# Patient Record
Sex: Female | Born: 1940 | Race: White | Hispanic: No | Smoking: Former smoker
Health system: Southern US, Community
[De-identification: ages and names within clinical notes are randomized; demographics above are authoritative.]

## PROBLEM LIST (undated history)

## (undated) DIAGNOSIS — G43909 Migraine, unspecified, not intractable, without status migrainosus: Secondary | ICD-10-CM

## (undated) DIAGNOSIS — M8580 Other specified disorders of bone density and structure, unspecified site: Secondary | ICD-10-CM

## (undated) DIAGNOSIS — I8390 Asymptomatic varicose veins of unspecified lower extremity: Secondary | ICD-10-CM

## (undated) DIAGNOSIS — E785 Hyperlipidemia, unspecified: Secondary | ICD-10-CM

## (undated) DIAGNOSIS — R7989 Other specified abnormal findings of blood chemistry: Secondary | ICD-10-CM

## (undated) DIAGNOSIS — E079 Disorder of thyroid, unspecified: Secondary | ICD-10-CM

## (undated) DIAGNOSIS — M797 Fibromyalgia: Secondary | ICD-10-CM

## (undated) DIAGNOSIS — S060X9A Concussion with loss of consciousness of unspecified duration, initial encounter: Secondary | ICD-10-CM

## (undated) DIAGNOSIS — R945 Abnormal results of liver function studies: Secondary | ICD-10-CM

## (undated) DIAGNOSIS — K760 Fatty (change of) liver, not elsewhere classified: Secondary | ICD-10-CM

## (undated) DIAGNOSIS — G709 Myoneural disorder, unspecified: Secondary | ICD-10-CM

## (undated) HISTORY — DX: Asymptomatic varicose veins of unspecified lower extremity: I83.90

## (undated) HISTORY — PX: OTHER SURGICAL HISTORY: SHX169

## (undated) HISTORY — DX: Hyperlipidemia, unspecified: E78.5

## (undated) HISTORY — DX: Disorder of thyroid, unspecified: E07.9

## (undated) HISTORY — DX: Fibromyalgia: M79.7

## (undated) HISTORY — DX: Migraine, unspecified, not intractable, without status migrainosus: G43.909

## (undated) HISTORY — PX: BREAST SURGERY: SHX581

## (undated) HISTORY — DX: Other specified disorders of bone density and structure, unspecified site: M85.80

## (undated) HISTORY — DX: Abnormal results of liver function studies: R94.5

## (undated) HISTORY — PX: APPENDECTOMY: SHX54

## (undated) HISTORY — DX: Other specified abnormal findings of blood chemistry: R79.89

## (undated) HISTORY — DX: Concussion with loss of consciousness of unspecified duration, initial encounter: S06.0X9A

## (undated) HISTORY — PX: TONSILLECTOMY: SUR1361

## (undated) HISTORY — DX: Myoneural disorder, unspecified: G70.9

## (undated) HISTORY — DX: Fatty (change of) liver, not elsewhere classified: K76.0

## (undated) HISTORY — PX: TUBAL LIGATION: SHX77

## (undated) HISTORY — PX: ROTATOR CUFF REPAIR: SHX139

---

## 1998-08-14 ENCOUNTER — Encounter: Payer: Self-pay | Admitting: Family Medicine

## 1998-08-14 ENCOUNTER — Ambulatory Visit (HOSPITAL_COMMUNITY): Admission: RE | Admit: 1998-08-14 | Discharge: 1998-08-14 | Payer: Self-pay | Admitting: Family Medicine

## 1999-08-24 ENCOUNTER — Encounter: Admission: RE | Admit: 1999-08-24 | Discharge: 1999-08-24 | Payer: Self-pay | Admitting: Family Medicine

## 1999-08-24 ENCOUNTER — Encounter: Payer: Self-pay | Admitting: Family Medicine

## 1999-08-30 ENCOUNTER — Encounter: Admission: RE | Admit: 1999-08-30 | Discharge: 1999-08-30 | Payer: Self-pay | Admitting: Family Medicine

## 1999-08-30 ENCOUNTER — Encounter: Payer: Self-pay | Admitting: Family Medicine

## 2000-06-11 ENCOUNTER — Ambulatory Visit (HOSPITAL_COMMUNITY): Admission: RE | Admit: 2000-06-11 | Discharge: 2000-06-11 | Payer: Self-pay | Admitting: Family Medicine

## 2000-06-11 ENCOUNTER — Encounter: Payer: Self-pay | Admitting: Family Medicine

## 2000-07-18 ENCOUNTER — Ambulatory Visit (HOSPITAL_COMMUNITY): Admission: RE | Admit: 2000-07-18 | Discharge: 2000-07-18 | Payer: Self-pay | Admitting: Family Medicine

## 2000-07-18 ENCOUNTER — Encounter: Payer: Self-pay | Admitting: Family Medicine

## 2000-10-23 ENCOUNTER — Encounter: Payer: Self-pay | Admitting: Family Medicine

## 2000-10-23 ENCOUNTER — Encounter: Admission: RE | Admit: 2000-10-23 | Discharge: 2000-10-23 | Payer: Self-pay | Admitting: Family Medicine

## 2002-04-02 ENCOUNTER — Encounter: Admission: RE | Admit: 2002-04-02 | Discharge: 2002-04-02 | Payer: Self-pay | Admitting: Internal Medicine

## 2002-04-02 ENCOUNTER — Encounter: Payer: Self-pay | Admitting: Internal Medicine

## 2003-04-29 ENCOUNTER — Encounter: Admission: RE | Admit: 2003-04-29 | Discharge: 2003-04-29 | Payer: Self-pay | Admitting: Internal Medicine

## 2004-03-15 ENCOUNTER — Ambulatory Visit: Payer: Self-pay | Admitting: *Deleted

## 2004-05-01 ENCOUNTER — Encounter: Admission: RE | Admit: 2004-05-01 | Discharge: 2004-05-01 | Payer: Self-pay | Admitting: Internal Medicine

## 2005-11-04 ENCOUNTER — Encounter: Admission: RE | Admit: 2005-11-04 | Discharge: 2005-11-04 | Payer: Self-pay | Admitting: Internal Medicine

## 2006-03-19 ENCOUNTER — Ambulatory Visit: Payer: Self-pay | Admitting: Internal Medicine

## 2006-11-24 ENCOUNTER — Encounter: Admission: RE | Admit: 2006-11-24 | Discharge: 2006-11-24 | Payer: Self-pay | Admitting: Internal Medicine

## 2007-11-25 ENCOUNTER — Encounter: Admission: RE | Admit: 2007-11-25 | Discharge: 2007-11-25 | Payer: Self-pay | Admitting: Internal Medicine

## 2009-02-17 ENCOUNTER — Encounter: Admission: RE | Admit: 2009-02-17 | Discharge: 2009-02-17 | Payer: Self-pay | Admitting: Internal Medicine

## 2009-08-18 ENCOUNTER — Encounter (INDEPENDENT_AMBULATORY_CARE_PROVIDER_SITE_OTHER): Payer: Self-pay | Admitting: *Deleted

## 2009-09-20 ENCOUNTER — Encounter: Admission: RE | Admit: 2009-09-20 | Discharge: 2009-09-20 | Payer: Self-pay | Admitting: Orthopedic Surgery

## 2009-09-20 ENCOUNTER — Encounter (INDEPENDENT_AMBULATORY_CARE_PROVIDER_SITE_OTHER): Payer: Self-pay | Admitting: *Deleted

## 2009-09-22 ENCOUNTER — Ambulatory Visit: Payer: Self-pay | Admitting: Internal Medicine

## 2009-10-03 ENCOUNTER — Ambulatory Visit: Payer: Self-pay | Admitting: Internal Medicine

## 2009-10-04 ENCOUNTER — Encounter: Payer: Self-pay | Admitting: Internal Medicine

## 2010-01-24 ENCOUNTER — Encounter: Admission: RE | Admit: 2010-01-24 | Discharge: 2010-01-24 | Payer: Self-pay | Admitting: Orthopedic Surgery

## 2010-03-02 ENCOUNTER — Encounter
Admission: RE | Admit: 2010-03-02 | Discharge: 2010-03-02 | Payer: Self-pay | Source: Home / Self Care | Attending: Internal Medicine | Admitting: Internal Medicine

## 2010-03-07 ENCOUNTER — Encounter
Admission: RE | Admit: 2010-03-07 | Discharge: 2010-03-07 | Payer: Self-pay | Source: Home / Self Care | Attending: Internal Medicine | Admitting: Internal Medicine

## 2010-04-07 ENCOUNTER — Encounter: Payer: Self-pay | Admitting: Orthopedic Surgery

## 2010-04-17 NOTE — Miscellaneous (Signed)
Summary: anusol HC suppos. order  Clinical Lists Changes  Medications: Added new medication of ANUSOL-HC 25 MG  SUPP (HYDROCORTISONE ACETATE) one suppository per rectum every night - Signed Rx of ANUSOL-HC 25 MG  SUPP (HYDROCORTISONE ACETATE) one suppository per rectum every night;  #12 x 3;  Signed;  Entered by: Alease Frame RN;  Authorized by: Hart Carwin MD;  Method used: Electronically to [facility] [phone]*, [address] [place], [place], [place]  [address], Ph: [phone], Fax: [phone] Observations: Added new observation of ALLERGY REV: Done (10/03/2009 10:25)    Prescriptions: ANUSOL-HC 25 MG  SUPP (HYDROCORTISONE ACETATE) one suppository per rectum every night  #12 x 3   Entered by:   Alease Frame RN   Authorized by:   Hart Carwin MD   Signed by:   Alease Frame RN on 10/03/2009   Method used:   Electronically to        [facility] [phone]* (retail)       [address]       [place], [place]  [address]       Ph: [phone]       Fax: [phone]   RxID:   [id number]

## 2010-04-17 NOTE — Miscellaneous (Signed)
Summary: [facility]  Clinical Lists Changes  Medications: Added new medication of MIRALAX   POWD (POLYETHYLENE GLYCOL 3350) As per prep  instructions. - Signed Added new medication of DULCOLAX 5 MG  TBEC (BISACODYL) Day before procedure take 2 at 3pm and 2 at 8pm. - Signed Added new medication of REGLAN 10 MG  TABS (METOCLOPRAMIDE HCL) As per prep instructions. - Signed Rx of MIRALAX   POWD (POLYETHYLENE GLYCOL 3350) As per prep  instructions.;  #255gm x 0;  Signed;  Entered by: Ezra Sites RN;  Authorized by: Hart Carwin MD;  Method used: Electronically to [facility] [phone]*, [address], [place], [place], [place]  [address], Ph: [phone], Fax: [phone] Rx of DULCOLAX 5 MG  TBEC (BISACODYL) Day before procedure take 2 at 3pm and 2 at 8pm.;  #4 x 0;  Signed;  Entered by: Ezra Sites RN;  Authorized by: Hart Carwin MD;  Method used: Electronically to [facility] [phone]*, [address], [place], [place], [place]  [address], Ph: [phone], Fax: [phone] Rx of REGLAN 10 MG  TABS (METOCLOPRAMIDE HCL) As per prep instructions.;  #2 x 0;  Signed;  Entered by: Ezra Sites RN;  Authorized by: Hart Carwin MD;  Method used: Electronically to [facility] [phone]*, [address] [place], [place], [place]  [address], Ph: [phone], Fax: [phone] Allergies: Added new allergy or adverse reaction of PCN Added new allergy or adverse reaction of * ROXILOX Observations: Added new observation of NKA: F (09/22/2009 12:45)    Prescriptions: REGLAN 10 MG  TABS (METOCLOPRAMIDE HCL) As per prep instructions.  #2 x 0   Entered by:   Ezra Sites RN   Authorized by:   Hart Carwin MD   Signed by:   Ezra Sites RN on 09/22/2009   Method used:   Electronically to        [facility] [phone]* (retail)       [address]       [place], [place]  [address]       Ph: [phone]       Fax: [phone]  RxID:   [id number] DULCOLAX 5 MG  TBEC (BISACODYL) Day before procedure take 2 at 3pm and 2 at 8pm.  #4 x 0   Entered by:   Ezra Sites RN   Authorized by:   Hart Carwin MD   Signed by:   Ezra Sites RN on 09/22/2009   Method used:   Electronically to        [facility] [phone]* (retail)       [address]       [place], [place]  [address]       Ph: [phone]       Fax: [phone]   RxID:   [id number] MIRALAX   POWD (POLYETHYLENE GLYCOL 3350) As per prep  instructions.  #255gm x 0   Entered by:   Ezra Sites RN   Authorized by:   Hart Carwin MD   Signed by:   Ezra Sites RN on 09/22/2009   Method used:   Electronically to        [facility] [phone]* (retail)       [address]       [place], [place]  [address]       Ph: [phone]       Fax: [phone]   RxID:   [id number]

## 2010-04-17 NOTE — Letter (Signed)
Summary: Previsit letter  [facility]  [address] [place], [place] [address]   Phone: [phone]  Fax: [phone]       08/18/2009 MRN: [id number]  SELICIA WINDOM [address] [place], [place]  [address]  Dear Ms. Ro[place]horse,  Welcome to the Gastroenterology Division at [facility].    You are scheduled to see a nurse for your pre-procedure visit on 09/22/2009 at 1:00PM on the 3rd floor at [facility], [address]  We ask that you try to arrive at our office 15 minutes prior to your appointment time to allow for check-in.  Your nurse visit will consist of discussing your medical and surgical history, your immediate family medical history, and your medications.    Please bring a complete list of all your medications or, if you prefer, bring the medication bottles and we will list them.  We will need to be aware of both prescribed and over the counter drugs.  We will need to know exact dosage information as well.  If you are on blood thinners (Coumadin, Plavix, Aggrenox, Ticlid, etc.) please call our office today/prior to your appointment, as we need to consult with your physician about holding your medication.   Please be prepared to read and sign documents such as consent forms, a financial agreement, and acknowledgement forms.  If necessary, and with your consent, a friend or relative is welcome to sit-in on the nurse visit with you.  Please bring your insurance card so that we may make a copy of it.  If your insurance requires a referral to see a specialist, please bring your referral form from your primary care physician.  No co-pay is required for this nurse visit.     If you cannot keep your appointment, please call [phone] to cancel or reschedule prior to your appointment date.  This allows [place] the opportunity to schedule an appointment for another patient in need of care.    Thank you for choosing [facility] for your medical needs.  We  appreciate the opportunity to care for you.  Please visit [place] at our website  to learn more about our practice.                     Sincerely.                                                                                                                   The Gastroenterology Division

## 2010-04-17 NOTE — Letter (Signed)
Summary: [facility] Instructions  [facility]  [address] [place], [place] [address]   Phone: [phone]  Fax: [phone]       ADYLYNN HERTENSTEIN    12-29-1940    MRN: [id number]       Procedure Day /Date:  Tuesday 10/03/2009     Arrival Time: 8:30 am     Procedure Time: 9:30 am     Location of Procedure:                    _ x_  [facility] (4th Floor)    PREPARATION FOR COLONOSCOPY WITH MIRALAX  Starting 5 days prior to your procedure Thursday  7/14 do not eat nuts, seeds, popcorn, corn, beans, peas,  salads, or any raw vegetables.  Do not take any fiber supplements (e.g. Metamucil, Citrucel, and Benefiber). ____________________________________________________________________________________________________   THE DAY BEFORE YOUR PROCEDURE         DATE: Monday 7/18  1   Drink clear liquids the entire day-NO SOLID FOOD  2   Do not drink anything colored red or purple.  Avoid juices with pulp.  No orange juice.  3   Drink at least 64 oz. (8 glasses) of fluid/clear liquids during the day to prevent dehydration and help the prep work efficiently.  CLEAR LIQUIDS INCLUDE: Water Jello Ice Popsicles Tea (sugar ok, no milk/cream) Powdered fruit flavored drinks Coffee (sugar ok, no milk/cream) Gatorade Juice: apple, white grape, white cranberry  Lemonade Clear bullion, consomm, broth Carbonated beverages (any kind) Strained chicken noodle soup Hard Candy  4   Mix the entire bottle of Miralax with 64 oz. of Gatorade/Powerade in the morning and put in the refrigerator to chill.  5   At 3:00 pm take 2 Dulcolax/Bisacodyl tablets.  6   At 4:30 pm take one Reglan/Metoclopramide tablet.  7  Starting at 5:00 pm drink one 8 oz glass of the Miralax mixture every 15-20 minutes until you have finished drinking the entire 64 oz.  You should finish drinking prep around 7:30 or 8:00 pm.  8   If you are nauseated, you may take the 2nd Reglan/Metoclopramide  tablet at 6:30 pm.        9    At 8:00 pm take 2 more DULCOLAX/Bisacodyl tablets.     THE DAY OF YOUR PROCEDURE      DATE:  Tuesday 7/19  You may drink clear liquids until 7:30 am (2 HOURS BEFORE PROCEDURE).   MEDICATION INSTRUCTIONS  Unless otherwise instructed, you should take regular prescription medications with a small sip of water as early as possible the morning of your procedure.           OTHER INSTRUCTIONS  You will need a responsible adult at least 68 years of age to accompany you and drive you home.   This person must remain in the waiting room during your procedure.  Wear loose fitting clothing that is easily removed.  Leave jewelry and other valuables at home.  However, you may wish to bring a book to read or an iPod/MP3 player to listen to music as you wait for your procedure to start.  Remove all body piercing jewelry and leave at home.  Total time from sign-in until discharge is approximately 2-3 hours.  You should go home directly after your procedure and rest.  You can resume normal activities the day after your procedure.  The day of your procedure you should not:   Drive  Make legal decisions   Operate machinery   Drink alcohol   Return to work  You will receive specific instructions about eating, activities and medications before you leave.   The above instructions have been reviewed and explained to me by  Ezra Sites RN  September 22, 2009 1:68 PM     I fully understand and can verbalize these instructions _____________________________ Date _______

## 2010-04-17 NOTE — Procedures (Signed)
Summary: Colonoscopy  Patient: [name] Note: All result statuses are Final unless otherwise noted.  Tests: (1) Colonoscopy (COL)   COL Colonoscopy           DONE     [facility]     [address]     [place], [place]  [address]           COLONOSCOPY PROCEDURE REPORT           PATIENT:  Kelsey Warner, Kelsey Warner  MR#:  [id number]     BIRTHDATE:  05/14/1940, 68 yrs. old  GENDER:  female     ENDOSCOPIST:  Hedwig Morton. Juanda Chance, MD     REF. BY:  Robert Bellow, M.D.     PROCEDURE DATE:  10/03/2009     PROCEDURE:  Colonoscopy 19147     ASA CLASS:  Class I     INDICATIONS:  Routine Risk Screening, hematochezia     MEDICATIONS:   Versed 7 mg, Fentanyl 75 mcg           DESCRIPTION OF PROCEDURE:   After the risks benefits and     alternatives of the procedure were thoroughly explained, informed     consent was obtained.  Digital rectal exam was performed and     revealed no rectal masses.   The LB CF-[id number] endoscope     was introduced through the anus and advanced to the cecum, which     was identified by both the appendix and ileocecal valve, without     limitations.  The quality of the prep was good, using MiraLax.     The instrument was then slowly withdrawn as the colon was fully     examined.     <<PROCEDUREIMAGES>>           FINDINGS:  A sessile polyp was found. at 70 cm, 3 mm polyp Polyp     was snared without cautery. Retrieval was successful (see image4).     snare polyp  Mild diverticulosis was found in the sigmoid colon     (see image5 and image6).  Internal hemorrhoids were found (see     image7).  This was otherwise a normal examination of the colon     (see image3, image2, and image1).   Retroflexed views in the     rectum revealed no abnormalities.    The scope was then withdrawn     from the patient and the procedure completed.           COMPLICATIONS:  None     ENDOSCOPIC IMPRESSION:     1) Sessile polyp     2) Mild diverticulosis in the sigmoid colon     3)  Internal hemorrhoids     4) Otherwise normal examination     RECOMMENDATIONS:     1) Await pathology results     2) High fiber diet.     Anusol HC supp 1 hs, #12, 3 refills, if no improvement in     bleeding, consider band ligation     REPEAT EXAM:  In 10 year(s) for.           ______________________________     Hedwig Morton. Juanda Chance, MD           CC:           n.     eSIGNED:   Hedwig Morton. Alanya Vukelich at 10/03/2009 10:12 AM           Hulan Fray,  [id number]  Note: An exclamation mark (!) indicates a result that was not dispersed into the flowsheet. Document Creation Date: 10/03/2009 10:14 AM _______________________________________________________________________  (1) Order result status: Final Collection or observation date-time: 10/03/2009 10:03 Requested date-time:  Receipt date-time:  Reported date-time:  Referring Physician:   Ordering Physician: Lina Sar [phone]) Specimen Source:  Source: Launa Grill Order Number: [id number] Lab site:   Appended Document: Colonoscopy recall 10 years  Appended Document: Colonoscopy     Procedures Next Due Date:    Colonoscopy: 10/2019

## 2010-04-17 NOTE — Letter (Signed)
Summary: Patient Notice- Polyp Results  [facility]  [address] [place], [place] [address]   Phone: [phone]  Fax: [phone]        October 04, 2009 MRN: [id number]    Kelsey Warner [address] [place], [place]  [address]    Dear Ms. Boerner,  I am pleased to inform you that the colon polyp(s) removed during your recent colonoscopy was (were) found to be benign (no cancer detected) upon pathologic examination.The polyp consisted of lymphatic tissue ( not premalignant)  I recommend you have a repeat colonoscopy examination in 10_ years to look for recurrent polyps, as having colon polyps increases your risk for having recurrent polyps or even colon cancer in the future.  Should you develop new or worsening symptoms of abdominal pain, bowel habit changes or bleeding from the rectum or bowels, please schedule an evaluation with either your primary care physician or with me.  Additional information/recommendations:  _x_ No further action with gastroenterology is needed at this time. Please      follow-up with your primary care physician for your other healthcare      needs.  __ Please call [phone] to schedule a return visit to review your      situation.  __ Please keep your follow-up visit as already scheduled.  __ Continue treatment plan as outlined the day of your exam.  Please call us if you are having persistent problems or have questions about your condition that have not been fully answered at this time.  Sincerely,  Hart Carwin MD  This letter has been electronically signed by your physician.  Appended Document: Patient Notice- Polyp Results letter mailed

## 2010-08-03 NOTE — Assessment & Plan Note (Signed)
[facility]                         GASTROENTEROLOGY OFFICE NOTE   Kelsey Warner, Kelsey Warner                      MRN:          [id number]  DATE:03/19/2006                            DOB:          [birth year]    GI CONSULTATION:  Kelsey Warner is a very nice, 70 year old white female, who is here to  discuss colorectal screening.  She had a pre-GEN-PLUS genetic testing of  stool for colon cancer in 2005, which was negative.  We do not have the  results of it; she has them at home.  She would like to avoid having  colonoscopy, virtual colonoscopy or barium enema, and would like some  information on how reliable this test is.  The patient is having  intermittent rectal bleeding, associated with external hemorrhoids.  These have been banded and treated in the past by a surgeon, last time  about six years ago.  She has no family history of colon cancer and  takes two stool softeners daily for intermittent constipation.   MEDICATIONS:  1. Evista 60 mg p.o. daily.  2. Synthroid 137 micrograms daily.  3. Ultram 50 mg daily.  4. Zetia.  5. Aspirin 81 mg daily.  6. Vitamin B12, D, E daily.   PAST HISTORY:  High cholesterol, treated by Dr. Perrin Maltese.  Thyroid  problems.  Chronic headaches.  Breast surgery in 1968.  Appendectomy in  1968.  Tubal ligation in 1970.  Hemorrhoid surgery.   FAMILY HISTORY:  Positive for heart disease in father and maternal  grandmother.  Diabetes in father and maternal grandmother.   SOCIAL HISTORY:  She has three children, works as an Human resources officer.  She does not smoke, does not drink alcohol.   REVIEW OF SYSTEMS:  Positive for contact lenses, swelling of her feet,  sleeping problems, leakage of urine and muscle cramps.   PHYSICAL EXAMINATION:  Blood pressure 118/64, pulse 80 and weight 184  pounds.  The patient was not examined.   We have discussed colorectal screening.  Specifically, barium enema,  virtual  colonoscopy, colonoscopy.  I also reviewed  the Pre-GEN-PLUS  test information on [employer]. and it is the opinion of the Review  Board, that the PreGen Plus test  is not currently recommended as a  routine colorectal screening for two reasons.  First, because of high  cost and second,  because of the low sensitivity.  Studies have shown  unknown correlation between finding polyps and actually  False negative  test.  It seems there is a high percentage of false negative stool tests  on Pre-GEN-PLUS tests.  The patient is quite concerned about having to  do prep for colonoscopy because of possibility of low potassium due to  the diarrhea, crampy abdominal pain and weakness.  I have told her that  all three colon cancer screening tests include colon prep.  In terms of  definite answer, colonoscopy is the recommended test.  Barium enema and  virtual colonoscopy, if normal, are also the definitive tests, but if  abnormal, would require a colonoscopy.  The only risk of colonoscopy  would be perforation and bleeding.  Post-polypectomy bleed was also  discussed with the patient.  She is today, not clear about which way she  is going to decide.  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  We have  discussed how to schedule her colonoscopy.if she opts for it.  I think  she could be a direct schedule, should she decide for this modality.     Hedwig Morton. Juanda Chance, MD  Electronically Signed    DMB/MedQ  DD: 03/19/2006  DT: 03/19/2006  Job #: [id number]   cc:   Jonita Albee, M.D.

## 2011-01-01 ENCOUNTER — Encounter: Payer: Self-pay | Admitting: Pulmonary Disease

## 2011-01-02 ENCOUNTER — Ambulatory Visit (INDEPENDENT_AMBULATORY_CARE_PROVIDER_SITE_OTHER): Payer: Medicare Other | Admitting: Pulmonary Disease

## 2011-01-02 ENCOUNTER — Encounter: Payer: Self-pay | Admitting: Pulmonary Disease

## 2011-01-02 DIAGNOSIS — R0609 Other forms of dyspnea: Secondary | ICD-10-CM

## 2011-01-02 DIAGNOSIS — R0600 Dyspnea, unspecified: Secondary | ICD-10-CM | POA: Insufficient documentation

## 2011-01-02 DIAGNOSIS — R0602 Shortness of breath: Secondary | ICD-10-CM

## 2011-01-02 NOTE — Patient Instructions (Signed)
Will schedule for breathing studies in 3 weeks, with followup with me on same day. If something is found on your cardiac evaluation, we can cancel your breathing studies. Work on weight reduction and conditioning. Stop your inhaler. Will get disk of your cxr for review.

## 2011-01-02 NOTE — Assessment & Plan Note (Signed)
The patient has dyspnea on exertion of unknown etiology.  She apparently had cardiomegaly on a recent chest x-ray, and she is scheduled for a cardiac evaluation the end of the week.  She does have a history of smoking in the past, but no history for pulmonary disease.  Her lungs are totally clear today on exam.  At this point, she will need full pulmonary function studies for evaluation.  I will go ahead and schedule these for a few weeks from now, so that if something is found from a cardiac standpoint they can be canceled.  Will also get a disc of her chest x-ray for review.  I have encouraged her to work aggressively on weight loss and conditioning.

## 2011-01-02 NOTE — Progress Notes (Signed)
  Subjective:    Patient ID: Kelsey Warner, female    DOB: 1940/05/07, 70 y.o.   MRN: [id number]  HPI The patient is a 70 year old female who I've been asked to see for dyspnea on exertion.  The patient states that she has had some degree of DOE for the last few years, but it has worsened since the spring.  She states that it can occur at rest or with exertion, and that she feels she cannot get a deep breath at times.  She describes a one block dyspnea on exertion at a moderate pace on flat ground, and can get winded bringing groceries in from the car, walking one flight of stairs, and doing light housework.  She has had a recent chest x-ray which apparently showed cardiomegaly, and has been started on a diuretic for lower extremity edema.  She has an appointment with a cardiologist this week for further evaluation.  She does have a history of smoking until 1979, and has been unable to do pulmonary function studies in the past.  She states that her weight is neutral over the last one year.  She does have a history of thyroid disease, but tells me that her labs have been under good control.  The patient also has had what sounds like an upper airway cough on the rare occasion that she sneezes or if she tries to take an extra deep breath.  She does not cough on a routine basis, nor has she produced mucus.   Review of Systems  Constitutional: Negative for fever and unexpected weight change.  HENT: Positive for sneezing. Negative for ear pain, nosebleeds, congestion, sore throat, rhinorrhea, trouble swallowing, dental problem, postnasal drip and sinus pressure.   Eyes: Negative for redness and itching.  Respiratory: Positive for cough and shortness of breath. Negative for choking, chest tightness and wheezing.   Cardiovascular: Positive for palpitations and leg swelling.  Gastrointestinal: Negative for nausea and vomiting.  Genitourinary: Negative for dysuria.  Musculoskeletal: Positive for joint  swelling.  Skin: Positive for rash.  Neurological: Negative for headaches.  Hematological: Does not bruise/bleed easily.  Psychiatric/Behavioral: Negative for dysphoric mood. The patient is not nervous/anxious.        Objective:   Physical Exam Constitutional:  Obese female, no acute distress  HENT:  Nares patent without discharge, but narrowed bilat.   Oropharynx without exudate, palate and uvula are normal  Eyes:  Perrla, eomi, no scleral icterus  Neck:  No JVD, no TMG  Cardiovascular:  Normal rate, regular rhythm, no rubs or gallops.  No murmurs        Intact distal pulses  Pulmonary :  Normal breath sounds, no stridor or respiratory distress   No rales, rhonchi, or wheezing  Abdominal:  Soft, nondistended, bowel sounds present.  No tenderness noted.   Musculoskeletal:  No lower extremity edema noted.  Lymph Nodes:  No cervical lymphadenopathy noted  Skin:  No cyanosis noted  Neurologic:  Alert, appropriate, moves all 4 extremities without obvious deficit.         Assessment & Plan:

## 2011-01-04 ENCOUNTER — Ambulatory Visit (INDEPENDENT_AMBULATORY_CARE_PROVIDER_SITE_OTHER): Payer: Medicare Other | Admitting: Internal Medicine

## 2011-01-04 ENCOUNTER — Encounter: Payer: Self-pay | Admitting: Internal Medicine

## 2011-01-04 DIAGNOSIS — R0609 Other forms of dyspnea: Secondary | ICD-10-CM

## 2011-01-04 DIAGNOSIS — R0989 Other specified symptoms and signs involving the circulatory and respiratory systems: Secondary | ICD-10-CM

## 2011-01-04 DIAGNOSIS — R0602 Shortness of breath: Secondary | ICD-10-CM

## 2011-01-04 DIAGNOSIS — R0600 Dyspnea, unspecified: Secondary | ICD-10-CM

## 2011-01-04 LAB — BASIC METABOLIC PANEL
BUN: 17 mg/dL (ref 6–23)
Chloride: 100 mEq/L (ref 96–112)
Creatinine, Ser: 0.7 mg/dL (ref 0.4–1.2)
GFR: 87.05 mL/min (ref >60.00)
Glucose, Bld: 98 mg/dL (ref 70–99)
Potassium: 3.7 mEq/L (ref 3.5–5.1)

## 2011-01-04 NOTE — Progress Notes (Signed)
HPI  Patient is a 70 year old with no prior Hx of CAD or CHF.   Has had intermmittent LE edema (just in June) for 15 years .  This summer worse. Breathing is troubling at times Works full time--[employer].  Usually very energetic.  In last 6 months harder to do things. No signif CP. No PND. 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 Last blood work in June.  Cholesterol high.  Loves fried foods.  Eats vegetables too.  Allergies  Allergen Reactions  . Latex     Also allergic to Roxolox  . Penicillins     REACTION: hives, rash  . Statins     Current Outpatient Prescriptions  Medication Sig Dispense Refill  . aspirin 81 MG tablet Take 81 mg by mouth daily.        . Cholecalciferol (VITAMIN D-3 PO) Take by mouth. TAKE 1 TAB DAILY       . furosemide (LASIX) 20 MG tablet Take 20 mg by mouth daily.        Marland Kitchen levothyroxine (SYNTHROID, LEVOTHROID) 137 MCG tablet Take 137 mcg by mouth daily.        Marland Kitchen OVER THE COUNTER MEDICATION FISH OIL  2 TABS DAILY       . OVER THE COUNTER MEDICATION LEG CRAMP PILLS 1 TAB DAILY       . Rizatriptan Benzoate (MAXALT PO) Take by mouth as needed.        . vitamin B-12 (CYANOCOBALAMIN) 1000 MCG tablet Take 1,000 mcg by mouth daily.          Past Medical History  Diagnosis Date  . Hyperlipidemia     Past Surgical History  Procedure Date  . Appendectomy   . Tonsillectomy   . Tubal ligation   . Breast surgery     Family History  Problem Relation Age of Onset  . Pulmonary fibrosis Father   . Pulmonary fibrosis Paternal Uncle   . Heart disease Father   . Heart disease Maternal Grandmother     History   Social History  . Marital Status: Widowed    Spouse Name: N/A    Number of Children: 3  . Years of Education: N/A   Occupational History  . [employer].    Social History Main Topics  . Smoking status: Former Smoker -- 0.8 packs/day for 20 years    Types: Cigarettes    Quit date: 03/18/1977  . Smokeless tobacco: Not on file  . Alcohol Use:  Not on file  . Drug Use: Not on file  . Sexually Active: Not on file   Other Topics Concern  . Not on file   Social History Narrative   Pt lives alone.     Review of Systems:  All systems reviewed.  They are negative to the above problem except as previously stated.  Vital Signs: BP 136/83  Pulse 88  Ht 5\' 7"  (1.702 m)  Wt 180 lb (81.647 kg)  BMI 28.19 kg/m2  Physical Exam  Patient is in NAD HEENT:  Normocephalic, atraumatic. EOMI, PERRLA.  Neck: JVP is normal. No thyromegaly. No bruits.  Lungs: clear to auscultation. No rales no wheezes.  Heart: Regular rate and rhythm. Normal S1, S2. No S3.   No significant murmurs. PMI not displaced.  Abdomen:  Supple, nontender. Normal bowel sounds. No masses. No hepatomegaly.  Extremities:   Good distal pulses throughout. No lower extremity edema.  Musculoskeletal :moving all extremities.  Neuro:  alert and oriented x3.  CN II-XII grossly intact.  EKG:  Sinus rhythm.  88 bpm.  T wave inversion in V6, 1, AVL  Assessment and Plan:

## 2011-01-04 NOTE — Patient Instructions (Signed)
BMP Lab work. Will call you with results.  Your physician has requested that you have an echocardiogram. Echocardiography is a painless test that uses sound waves to create images of your heart. It provides your doctor with information about the size and shape of your heart and how well your heart's chambers and valves are working. This procedure takes approximately one hour. There are no restrictions for this procedure.

## 2011-01-07 DIAGNOSIS — R0602 Shortness of breath: Secondary | ICD-10-CM | POA: Insufficient documentation

## 2011-01-07 NOTE — Assessment & Plan Note (Signed)
Patient's symptoms are concerning.  She is also being seen by K Clance..  I would recomm starting with an echo to evaluate LVEF.  If normal.  Consider further eval with myoview.  WIll also define diastolic function.  If LVEF is depressed sched cath.   Will check BMET today.

## 2011-01-11 ENCOUNTER — Ambulatory Visit (HOSPITAL_COMMUNITY): Payer: Medicare Other | Attending: Cardiology

## 2011-01-11 DIAGNOSIS — R0602 Shortness of breath: Secondary | ICD-10-CM

## 2011-01-11 DIAGNOSIS — R0989 Other specified symptoms and signs involving the circulatory and respiratory systems: Secondary | ICD-10-CM

## 2011-01-11 DIAGNOSIS — R0609 Other forms of dyspnea: Secondary | ICD-10-CM | POA: Insufficient documentation

## 2011-01-11 DIAGNOSIS — Z87891 Personal history of nicotine dependence: Secondary | ICD-10-CM | POA: Insufficient documentation

## 2011-01-17 ENCOUNTER — Telehealth: Payer: Self-pay | Admitting: Internal Medicine

## 2011-01-17 NOTE — Telephone Encounter (Signed)
New message  Pt is calling about Echo test results. She has not heard anything.  Please call her back

## 2011-01-18 ENCOUNTER — Telehealth: Payer: Self-pay | Admitting: *Deleted

## 2011-01-18 NOTE — Telephone Encounter (Signed)
Called patient with normal BMET results.

## 2011-01-21 NOTE — Telephone Encounter (Signed)
Patient was called with echo result on 11/2.

## 2011-01-23 ENCOUNTER — Encounter (HOSPITAL_COMMUNITY): Payer: Medicare Other | Admitting: Radiology

## 2011-01-23 ENCOUNTER — Ambulatory Visit (HOSPITAL_COMMUNITY): Payer: Medicare Other | Attending: Cardiovascular Disease | Admitting: Radiology

## 2011-01-23 DIAGNOSIS — R0989 Other specified symptoms and signs involving the circulatory and respiratory systems: Secondary | ICD-10-CM

## 2011-01-23 DIAGNOSIS — Z87891 Personal history of nicotine dependence: Secondary | ICD-10-CM | POA: Insufficient documentation

## 2011-01-23 DIAGNOSIS — R110 Nausea: Secondary | ICD-10-CM | POA: Insufficient documentation

## 2011-01-23 DIAGNOSIS — R079 Chest pain, unspecified: Secondary | ICD-10-CM | POA: Insufficient documentation

## 2011-01-23 DIAGNOSIS — R000 Tachycardia, unspecified: Secondary | ICD-10-CM | POA: Insufficient documentation

## 2011-01-23 DIAGNOSIS — R5381 Other malaise: Secondary | ICD-10-CM | POA: Insufficient documentation

## 2011-01-23 DIAGNOSIS — R0789 Other chest pain: Secondary | ICD-10-CM

## 2011-01-23 DIAGNOSIS — R0609 Other forms of dyspnea: Secondary | ICD-10-CM | POA: Insufficient documentation

## 2011-01-23 DIAGNOSIS — Z8249 Family history of ischemic heart disease and other diseases of the circulatory system: Secondary | ICD-10-CM | POA: Insufficient documentation

## 2011-01-23 DIAGNOSIS — E785 Hyperlipidemia, unspecified: Secondary | ICD-10-CM | POA: Insufficient documentation

## 2011-01-23 DIAGNOSIS — R002 Palpitations: Secondary | ICD-10-CM | POA: Insufficient documentation

## 2011-01-23 DIAGNOSIS — R0602 Shortness of breath: Secondary | ICD-10-CM | POA: Insufficient documentation

## 2011-01-23 MED ORDER — TECHNETIUM TC 99M TETROFOSMIN IV KIT
11.0000 | PACK | Freq: Once | INTRAVENOUS | Status: AC | PRN
  Administered 2011-01-23: 11 via INTRAVENOUS

## 2011-01-23 MED ORDER — TECHNETIUM TC 99M TETROFOSMIN IV KIT
33.0000 | PACK | Freq: Once | INTRAVENOUS | Status: AC | PRN
  Administered 2011-01-23: 33 via INTRAVENOUS

## 2011-01-23 NOTE — Progress Notes (Signed)
[facility] SITE 3 NUCLEAR MED [address] [place] [address] [phone]  Cardiology Nuclear Med Study  Kelsey Warner is a 70 y.o. female [id number] October 15, 1940   Nuclear Med Background Indication for Stress Test:  Evaluation for Ischemia History:  10/12 Echo:EF=55-60% Cardiac Risk Factors: Family History - CAD, History of Smoking and Lipids  Symptoms:  Chest Pain/Pressure and Tightness (last episode of chest discomfort was about 2-days ago), DOE/SOB, Fatigue, Nausea, Palpitations and Rapid HR    Nuclear Pre-Procedure Caffeine/Decaff Intake:  None NPO After: 12:00am   Lungs:  Clear. IV 0.9% NS with Angio Cath:  20g  IV Site: R Antecubital  IV Started by:  Bonnita Levan, RN  Chest Size (in):  40 Cup Size: D  Height: 5\' 7"  (1.702 m)  Weight:  182 lb (82.555 kg)  BMI:  Body mass index is 28.51 kg/(m^2). Tech Comments:  N/A    Nuclear Med Study 1 or 2 day study: 1 day  Stress Test Type:  Stress  Reading MD: Charlton Haws, MD  Order Authorizing Provider:  Dietrich Pates, MD  Resting Radionuclide: Technetium 95m Tetrofosmin  Resting Radionuclide Dose: 11.0 mCi   Stress Radionuclide:  Technetium 66m Tetrofosmin  Stress Radionuclide Dose: 33.0 mCi           Stress Protocol Rest HR: 62 Stress HR: 142  Rest BP: Sitting:142/74  Standing:138/76 Stress BP: 224/50  Exercise Time (min): 5:00 METS: 7.0   Predicted Max HR: 153 bpm % Max HR: 92.81 bpm Rate Pressure Product: 41324   Dose of Adenosine (mg):  n/a Dose of Lexiscan: n/a mg  Dose of Atropine (mg): n/a Dose of Dobutamine: n/a mcg/kg/min (at max HR)  Stress Test Technologist: Smiley Houseman, CMA-N  Nuclear Technologist:  Doyne Keel, CNMT     Rest Procedure:  Myocardial perfusion imaging was performed at rest 45 minutes following the intravenous administration of Technetium 93m Tetrofosmin.  Rest ECG: No acute changes.  Stress Procedure:  The patient exercised for five minutes on the treadmill  utilizing the Bruce protocol.  The patient stopped due to moderate dyspnea, with O2 SATS remaining at 98%.  She denied any chest pain.  There were no diagnostic ST-T wave changes during exercise, but 6-minutes into recovery she had diffuse T-wave inversion.  She had occasional PVC's and a hypertensive response to exercise, 224/50.  Technetium 98m Tetrofosmin was injected at peak exercise and myocardial perfusion imaging was performed after a brief delay.  Stress ECG: No significant change from baseline ECG  QPS Raw Data Images:  Normal; no motion artifact; normal heart/lung ratio. Stress Images:  Normal homogeneous uptake in all areas of the myocardium. Rest Images:  Normal homogeneous uptake in all areas of the myocardium. Subtraction (SDS):  Normal Transient Ischemic Dilatation (Normal <1.22):  0.86 Lung/Heart Ratio (Normal <0.45):  0.39  Quantitative Gated Spect Images QGS EDV:  68 ml QGS ESV:  23 ml QGS cine images:  NL LV Function; NL Wall Motion QGS EF: 66%  Impression Exercise Capacity:  Fair exercise capacity. BP Response:  Hypertensive blood pressure response. Clinical Symptoms:  There is dyspnea. ECG Impression:  No significant ST segment change suggestive of ischemia. Comparison with Prior Nuclear Study: No images to compare  Overall Impression:  Normal stress nuclear study.    Charlton Haws

## 2011-01-29 ENCOUNTER — Ambulatory Visit (INDEPENDENT_AMBULATORY_CARE_PROVIDER_SITE_OTHER): Payer: Medicare Other | Admitting: Pulmonary Disease

## 2011-01-29 ENCOUNTER — Encounter: Payer: Self-pay | Admitting: Pulmonary Disease

## 2011-01-29 VITALS — BP 138/68 | HR 110 | Temp 98.4°F | Ht 67.0 in | Wt 184.0 lb

## 2011-01-29 DIAGNOSIS — R0602 Shortness of breath: Secondary | ICD-10-CM

## 2011-01-29 DIAGNOSIS — R0600 Dyspnea, unspecified: Secondary | ICD-10-CM

## 2011-01-29 DIAGNOSIS — R0609 Other forms of dyspnea: Secondary | ICD-10-CM

## 2011-01-29 LAB — PULMONARY FUNCTION TEST

## 2011-01-29 NOTE — Patient Instructions (Signed)
Work on weight loss and conditioning, but call if you are not improving or if you are worsening.

## 2011-01-29 NOTE — Progress Notes (Signed)
PFT done today. 

## 2011-01-29 NOTE — Progress Notes (Signed)
  Subjective:    Patient ID: Kelsey Warner, female    DOB: February 06, 1941, 70 y.o.   MRN: [id number]  HPI Patient comes in today for followup of her pulmonary function studies, done as part of a workup for dyspnea on exertion.  Since last visit, she has had a negative stress test as well as an unremarkable echocardiogram.  Her pulmonary function studies today are totally normal.  I have reviewed these in detail with her, and answered all of her questions.   Review of Systems  Constitutional: Negative for fever and unexpected weight change.  HENT: Positive for trouble swallowing. Negative for ear pain, nosebleeds, congestion, sore throat, rhinorrhea, sneezing, dental problem, postnasal drip and sinus pressure.   Eyes: Positive for redness and itching.  Respiratory: Positive for cough, chest tightness and shortness of breath. Negative for wheezing.   Cardiovascular: Positive for palpitations. Negative for leg swelling.  Gastrointestinal: Negative for nausea and vomiting.  Genitourinary: Negative for dysuria.  Musculoskeletal: Negative for joint swelling.  Skin: Negative for rash.  Neurological: Negative for headaches.  Hematological: Bruises/bleeds easily.  Psychiatric/Behavioral: Negative for dysphoric mood. The patient is not nervous/anxious.        Objective:   Physical Exam Overweight female in no acute distress Nose without purulence or discharge noted Lower extremities without edema, no cyanosis Alert and oriented, moves all 4 extremities.       Assessment & Plan:

## 2011-01-29 NOTE — Assessment & Plan Note (Signed)
The patient has had a negative cardiac and pulmonary evaluation for her symptom of dyspnea on exertion.  I suspect this is due to weight and conditioning, but I have offered further testing with a cardiopulmonary exercise test if she continues to be uncomfortable with her symptomatology.  However, I have told her that further testing at this point would probably have a low yield.  The patient would like to take the next 4-6 months to work on weight loss and a conditioning program, and will call us if she does not improve or if she worsens.

## 2011-02-26 ENCOUNTER — Telehealth: Payer: Self-pay | Admitting: Internal Medicine

## 2011-02-26 NOTE — Telephone Encounter (Signed)
PT WANTING TO KNOW IF NEEDS TO CONT TO TAKE  FUROSEMIDE  SINCE ECHO AND MYOVIEW WERE NORM . WILL FORWARD TO  DR ROSS FOR REVIEWE .Zack Seal

## 2011-02-26 NOTE — Telephone Encounter (Signed)
New Msg: Pt calling wanting to know if Dr. Tenny Craw wants pt to continue taking fluid pill. Please return pt call to discuss further.

## 2011-02-26 NOTE — Telephone Encounter (Signed)
She came in to see me on Lasix.  It is a low dose.  Prob helping BP I would recomm that she stay on it and not stop.

## 2011-03-01 NOTE — Telephone Encounter (Signed)
Called patient back. She is aware to stay on Lasix per Dr.Ross.

## 2011-04-01 ENCOUNTER — Other Ambulatory Visit: Payer: Self-pay | Admitting: *Deleted

## 2011-04-01 MED ORDER — FUROSEMIDE 20 MG PO TABS: 20.0000 mg | ORAL_TABLET | Freq: Every day | ORAL | Status: DC

## 2011-04-04 ENCOUNTER — Other Ambulatory Visit: Payer: Self-pay | Admitting: Internal Medicine

## 2011-04-04 DIAGNOSIS — Z1231 Encounter for screening mammogram for malignant neoplasm of breast: Secondary | ICD-10-CM

## 2011-04-12 ENCOUNTER — Ambulatory Visit: Payer: Medicare Other

## 2011-04-19 ENCOUNTER — Ambulatory Visit
Admission: RE | Admit: 2011-04-19 | Discharge: 2011-04-19 | Disposition: A | Discharge status: Home / Self Care | Payer: Medicare Other | Source: Ambulatory Visit | Attending: Internal Medicine | Admitting: Internal Medicine

## 2011-04-19 DIAGNOSIS — Z1231 Encounter for screening mammogram for malignant neoplasm of breast: Secondary | ICD-10-CM

## 2011-04-25 DIAGNOSIS — H2510 Age-related nuclear cataract, unspecified eye: Secondary | ICD-10-CM | POA: Diagnosis not present

## 2011-04-25 DIAGNOSIS — H43819 Vitreous degeneration, unspecified eye: Secondary | ICD-10-CM | POA: Diagnosis not present

## 2011-04-25 DIAGNOSIS — H5210 Myopia, unspecified eye: Secondary | ICD-10-CM | POA: Diagnosis not present

## 2011-07-28 ENCOUNTER — Ambulatory Visit (INDEPENDENT_AMBULATORY_CARE_PROVIDER_SITE_OTHER): Payer: Medicare Other | Admitting: Emergency Medicine

## 2011-07-28 VITALS — BP 137/77 | HR 90 | Temp 98.5°F | Resp 16 | Ht 67.0 in | Wt 181.0 lb

## 2011-07-28 DIAGNOSIS — S058X9A Other injuries of unspecified eye and orbit, initial encounter: Secondary | ICD-10-CM | POA: Diagnosis not present

## 2011-07-28 DIAGNOSIS — H5710 Ocular pain, unspecified eye: Secondary | ICD-10-CM

## 2011-07-28 DIAGNOSIS — E039 Hypothyroidism, unspecified: Secondary | ICD-10-CM

## 2011-07-28 DIAGNOSIS — S0500XA Injury of conjunctiva and corneal abrasion without foreign body, unspecified eye, initial encounter: Secondary | ICD-10-CM

## 2011-07-28 DIAGNOSIS — H5711 Ocular pain, right eye: Secondary | ICD-10-CM

## 2011-07-28 LAB — TSH: TSH: 0.039 u[IU]/mL — ABNORMAL LOW (ref 0.350–4.500)

## 2011-07-28 MED ORDER — OFLOXACIN 0.3 % OP SOLN: 1.0000 [drp] | OPHTHALMIC | Status: AC

## 2011-07-28 NOTE — Patient Instructions (Signed)
Corneal Abrasion The cornea is the clear covering at the front and center of the eye. When looking at the colored portion (iris) of the eye, you are looking through that person's cornea.  This very thin tissue is made up of many layers. The surface layer is a single layer of cells called the corneal epithelium. This is one of the most sensitive tissues in the body. If a scratch or injury causes the corneal epithelium to come off, it is called a corneal abrasion. If the injury extends to the tissues below the epithelium, the condition is called a corneal ulcer.  CAUSES   Scratches.   Trauma.   Foreign body in the eye.   Some people have recurrences of abrasions in the area of the original injury even after they heal. This is called recurrent erosion syndrome. Recurrent erosion syndromes generally improve and go away with time.  SYMPTOMS   Eye pain.   Difficulty or inability to keep the injured eye open.   The eye becomes very sensitive to light.   Recurrent erosions tend to happen suddenly, first thing in the morning - usually upon awakening and opening the eyes.  DIAGNOSIS  Your eye professional can diagnose a corneal abrasion during an eye exam. Dye is usually placed in the eye using a drop or a small paper strip moistened by the patient's tears. When the eye is examined with a special light, the abrasion shows up clearly because of the dye. TREATMENT   Small abrasions may be treated with antibiotic drops or ointment alone.   Usually a pressure patch is specially applied. Pressure patches prevent the eye from blinking, allowing the corneal epithelium to heal. Because blinking is less, a pressure patch also reduces the amount of pain present in the eye during healing. Most corneal abrasions heal within 2-3 days with no effect on vision. WARNING: Do not drive or operate machinery while your eye is patched. Your ability to judge distances is impaired.   If abrasion becomes infected and  spreads to the deeper tissues of the cornea, a corneal ulcer can result. This is serious because it can cause corneal scarring. Corneal scars interfere with light passing through the cornea, and cause a loss of vision in the involved eye.   If your caregiver has given you a follow-up appointment, it is very important to keep that appointment. Not keeping the appointment could result in a severe eye infection or permanent loss of vision. If there is any problem keeping the appointment, you must call back to this facility for assistance.  SEEK MEDICAL CARE IF:   You have pain, light sensitivity and a scratchy feeling in one eye (or both).   Your pressure patch keeps loosening up and you can blink your eye under the patch after treatment.   Any kind of discharge develops from the involved eye after treatment or if the lids stick together in the morning.   You have the same symptoms in the morning as you did with the original abrasion days, weeks or months after the abrasion healed.  MAKE SURE YOU:   Understand these instructions.   Will watch your condition.   Will get help right away if you are not doing well or get worse.  Document Released: 03/01/2000 Document Revised: 02/21/2011 Document Reviewed: 10/08/2007 [employer] Patient Information 2012 [employer]. 

## 2011-07-28 NOTE — Progress Notes (Signed)
  Subjective:    Patient ID: Kelsey Warner, female    DOB: Aug 03, 1940, 71 y.o.   MRN: [id number]  HPI patient states she was out in the wind yesterday. She felt like her eyes dried out. She has extreme discomfort in her right now. She took her contact out last night and developed severe discomfort patient also has concerns about her thyroid disease. She states she's been losing her hair and is worried her thyroid is not under control. She has a history of hyperthyroid storm is currently on thyroid replacement.    Review of Systems     Objective:   Physical Exam physical exam reveals alert female who is in no distress pupils are equal and reactive to light. There is injection of the conjunctiva at the corneal scleral junction. Disc is normal. Everted and swabbed off topotecan were instilled and fluorescein was applied. There was uptake around the rim of the cornea at the corneal scleral junction. There appears to be a foreign body right in the central axis. I reading on the eye and use Q-tip x2 drawn through the area but I was unable to do so.        Assessment & Plan:  We'll go ahead and treat with Ocuflox. We'll keep her contacts out. Thyroid studies ordered. Patient to call her eye doctor first thing in the morning and get in for slit lamp evaluation.

## 2011-07-29 ENCOUNTER — Other Ambulatory Visit: Payer: Self-pay | Admitting: *Deleted

## 2011-07-29 DIAGNOSIS — H5710 Ocular pain, unspecified eye: Secondary | ICD-10-CM | POA: Diagnosis not present

## 2011-07-29 MED ORDER — LEVOTHYROXINE SODIUM 125 MCG PO TABS: 125.0000 ug | ORAL_TABLET | Freq: Every day | ORAL | Status: DC

## 2011-11-07 ENCOUNTER — Other Ambulatory Visit: Payer: Self-pay | Admitting: Internal Medicine

## 2011-12-19 ENCOUNTER — Ambulatory Visit (INDEPENDENT_AMBULATORY_CARE_PROVIDER_SITE_OTHER): Payer: Medicare Other | Admitting: Family Medicine

## 2011-12-19 VITALS — BP 146/82 | HR 106 | Temp 97.6°F | Resp 18 | Ht 67.5 in | Wt 180.0 lb

## 2011-12-19 DIAGNOSIS — E039 Hypothyroidism, unspecified: Secondary | ICD-10-CM | POA: Diagnosis not present

## 2011-12-19 DIAGNOSIS — Z23 Encounter for immunization: Secondary | ICD-10-CM

## 2011-12-19 DIAGNOSIS — E538 Deficiency of other specified B group vitamins: Secondary | ICD-10-CM

## 2011-12-19 NOTE — Progress Notes (Signed)
71 yo woman here for flu shot and thyroid check.  She notes some recent hair loss  Caring for 51 month old granddaughter.  Objective:  Alert and cheerful  HEENT:  No abnormalities noted Chest:  Clear Heart:  Regular  Assessment:  On thyroid replacement after radioactive ablation 16 years ago.  Plan: Check TSH Flu shot

## 2011-12-20 ENCOUNTER — Other Ambulatory Visit: Payer: Self-pay | Admitting: Family Medicine

## 2011-12-20 DIAGNOSIS — E039 Hypothyroidism, unspecified: Secondary | ICD-10-CM

## 2011-12-20 LAB — TSH: TSH: 0.09 u[IU]/mL — ABNORMAL LOW (ref 0.350–4.500)

## 2011-12-20 LAB — VITAMIN B12: Vitamin B-12: 1007 pg/mL — ABNORMAL HIGH (ref 211–911)

## 2011-12-20 MED ORDER — LEVOTHYROXINE SODIUM 125 MCG PO TABS: 125.0000 ug | ORAL_TABLET | Freq: Every day | ORAL | Status: DC

## 2011-12-21 ENCOUNTER — Telehealth: Payer: Self-pay

## 2011-12-21 ENCOUNTER — Other Ambulatory Visit: Payer: Self-pay | Admitting: Family Medicine

## 2011-12-21 DIAGNOSIS — E039 Hypothyroidism, unspecified: Secondary | ICD-10-CM

## 2011-12-21 MED ORDER — LEVOTHYROXINE SODIUM 112 MCG PO TABS: 112.0000 ug | ORAL_TABLET | Freq: Every day | ORAL | Status: DC

## 2011-12-21 NOTE — Telephone Encounter (Signed)
112 mcg Synthroid daily

## 2011-12-21 NOTE — Telephone Encounter (Signed)
Pt notified of labs. She was on at last visit. What dose do you want to decrease her too?

## 2011-12-21 NOTE — Telephone Encounter (Signed)
Spoke with pt advised to decrease to 112 mcg. Rx sent in. Pt understood

## 2012-04-07 ENCOUNTER — Other Ambulatory Visit: Payer: Self-pay | Admitting: Internal Medicine

## 2012-04-07 DIAGNOSIS — Z1231 Encounter for screening mammogram for malignant neoplasm of breast: Secondary | ICD-10-CM

## 2012-05-01 ENCOUNTER — Ambulatory Visit: Payer: Medicare Other

## 2012-05-05 ENCOUNTER — Ambulatory Visit
Admission: RE | Admit: 2012-05-05 | Discharge: 2012-05-05 | Disposition: A | Discharge status: Home / Self Care | Payer: Medicare Other | Source: Ambulatory Visit | Attending: Internal Medicine | Admitting: Internal Medicine

## 2012-05-05 DIAGNOSIS — Z1231 Encounter for screening mammogram for malignant neoplasm of breast: Secondary | ICD-10-CM | POA: Diagnosis not present

## 2012-06-09 DIAGNOSIS — I8310 Varicose veins of unspecified lower extremity with inflammation: Secondary | ICD-10-CM | POA: Diagnosis not present

## 2012-06-23 DIAGNOSIS — I8310 Varicose veins of unspecified lower extremity with inflammation: Secondary | ICD-10-CM | POA: Diagnosis not present

## 2012-07-15 DIAGNOSIS — M79609 Pain in unspecified limb: Secondary | ICD-10-CM | POA: Diagnosis not present

## 2012-07-15 DIAGNOSIS — I8310 Varicose veins of unspecified lower extremity with inflammation: Secondary | ICD-10-CM | POA: Diagnosis not present

## 2012-07-16 DIAGNOSIS — H5200 Hypermetropia, unspecified eye: Secondary | ICD-10-CM | POA: Diagnosis not present

## 2012-07-16 DIAGNOSIS — H2510 Age-related nuclear cataract, unspecified eye: Secondary | ICD-10-CM | POA: Diagnosis not present

## 2012-08-04 DIAGNOSIS — I8310 Varicose veins of unspecified lower extremity with inflammation: Secondary | ICD-10-CM | POA: Diagnosis not present

## 2012-08-04 DIAGNOSIS — M79609 Pain in unspecified limb: Secondary | ICD-10-CM | POA: Diagnosis not present

## 2012-10-21 ENCOUNTER — Other Ambulatory Visit: Payer: Self-pay

## 2012-12-19 ENCOUNTER — Other Ambulatory Visit: Payer: Self-pay | Admitting: Family Medicine

## 2012-12-21 ENCOUNTER — Other Ambulatory Visit: Payer: Self-pay | Admitting: Physician Assistant

## 2013-01-19 ENCOUNTER — Other Ambulatory Visit: Payer: Self-pay | Admitting: Physician Assistant

## 2013-01-19 DIAGNOSIS — Z23 Encounter for immunization: Secondary | ICD-10-CM | POA: Diagnosis not present

## 2013-01-21 ENCOUNTER — Other Ambulatory Visit: Payer: Self-pay

## 2013-02-08 ENCOUNTER — Other Ambulatory Visit: Payer: Self-pay | Admitting: Physician Assistant

## 2013-02-09 MED ORDER — LEVOTHYROXINE SODIUM 112 MCG PO TABS: 112.0000 ug | ORAL_TABLET | Freq: Every day | ORAL | Status: DC

## 2013-02-09 NOTE — Telephone Encounter (Signed)
Please call this patient AND send her a My Chart message. She's over due for follow up and labs.    Meds ordered this encounter  Medications  . levothyroxine (SYNTHROID, LEVOTHROID) 112 MCG tablet    Sig: Take 1 tablet (112 mcg total) by mouth daily before breakfast. PATIENT NEEDS OFFICE VISIT/LABS FOR ADDITIONAL REFILLS - 3rd NOTICE    Dispense:  15 tablet    Refill:  0

## 2013-02-10 MED ORDER — LEVOTHYROXINE SODIUM 112 MCG PO TABS: 112.0000 ug | ORAL_TABLET | Freq: Every day | ORAL | Status: DC

## 2013-02-10 NOTE — Telephone Encounter (Signed)
Patient due for visit, labs called her to advise. She states she called to make appt with Dr Perrin Maltese, was advised he is not accepting appts any longer. Discussed with Chelle, we can send in 30 day supply, and we will work with her until she establishes with another provider. She wants to know who you recommend see her for her primary care needs. Please advise.

## 2013-02-13 NOTE — Telephone Encounter (Signed)
ok 

## 2013-03-02 ENCOUNTER — Ambulatory Visit (INDEPENDENT_AMBULATORY_CARE_PROVIDER_SITE_OTHER): Payer: Medicare Other | Admitting: Family Medicine

## 2013-03-02 VITALS — BP 150/72 | HR 87 | Temp 98.7°F | Resp 18 | Ht 67.0 in | Wt 193.0 lb

## 2013-03-02 DIAGNOSIS — E039 Hypothyroidism, unspecified: Secondary | ICD-10-CM

## 2013-03-02 DIAGNOSIS — G43909 Migraine, unspecified, not intractable, without status migrainosus: Secondary | ICD-10-CM

## 2013-03-02 MED ORDER — RIZATRIPTAN BENZOATE 5 MG PO TABS: 5.0000 mg | ORAL_TABLET | ORAL | Status: DC | PRN

## 2013-03-02 MED ORDER — LEVOTHYROXINE SODIUM 112 MCG PO TABS: 112.0000 ug | ORAL_TABLET | Freq: Every day | ORAL | Status: DC

## 2013-03-02 NOTE — Progress Notes (Signed)
Subjective: Patient is here for her thyroid checked. It appears that her TSH is stable little bit on the low side over the last several visits and they have gradually cut her levothyroxine dose down to 0.112. She is doing well with no complaints. She does have a peripheral neuropathy which is been an issue for her for a long time, but she has chosen to live with it rather than take some of the medications that she didn't like the idea of the side effects of them. 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  Also has a history of migraines. Needs her Maxalt refill. Migraine history  Objective: Pleasant lady in no major distress. No thyromegaly chest clear. Heart regular.  Assessment:  Hypothyroidism  Migraine history   Plan: Check TSH

## 2013-03-02 NOTE — Patient Instructions (Addendum)
Continue current medication.   Use the Maxalt as needed.

## 2013-03-03 LAB — TSH: TSH: 2.237 u[IU]/mL (ref 0.350–4.500)

## 2013-04-26 ENCOUNTER — Other Ambulatory Visit: Payer: Self-pay

## 2013-04-26 DIAGNOSIS — Z1231 Encounter for screening mammogram for malignant neoplasm of breast: Secondary | ICD-10-CM

## 2013-04-28 DIAGNOSIS — I8310 Varicose veins of unspecified lower extremity with inflammation: Secondary | ICD-10-CM | POA: Diagnosis not present

## 2013-04-28 DIAGNOSIS — M79609 Pain in unspecified limb: Secondary | ICD-10-CM | POA: Diagnosis not present

## 2013-05-07 ENCOUNTER — Ambulatory Visit
Admission: RE | Admit: 2013-05-07 | Discharge: 2013-05-07 | Disposition: A | Discharge status: Home / Self Care | Payer: Medicare Other | Source: Ambulatory Visit

## 2013-05-07 DIAGNOSIS — Z1231 Encounter for screening mammogram for malignant neoplasm of breast: Secondary | ICD-10-CM | POA: Diagnosis not present

## 2013-05-12 DIAGNOSIS — M79609 Pain in unspecified limb: Secondary | ICD-10-CM | POA: Diagnosis not present

## 2013-05-12 DIAGNOSIS — I8310 Varicose veins of unspecified lower extremity with inflammation: Secondary | ICD-10-CM | POA: Diagnosis not present

## 2013-05-12 DIAGNOSIS — M7981 Nontraumatic hematoma of soft tissue: Secondary | ICD-10-CM | POA: Diagnosis not present

## 2013-05-27 DIAGNOSIS — I8310 Varicose veins of unspecified lower extremity with inflammation: Secondary | ICD-10-CM | POA: Diagnosis not present

## 2013-05-27 DIAGNOSIS — M79609 Pain in unspecified limb: Secondary | ICD-10-CM | POA: Diagnosis not present

## 2013-06-08 DIAGNOSIS — I8310 Varicose veins of unspecified lower extremity with inflammation: Secondary | ICD-10-CM | POA: Diagnosis not present

## 2013-06-08 DIAGNOSIS — M7981 Nontraumatic hematoma of soft tissue: Secondary | ICD-10-CM | POA: Diagnosis not present

## 2013-06-08 DIAGNOSIS — M79609 Pain in unspecified limb: Secondary | ICD-10-CM | POA: Diagnosis not present

## 2013-06-22 DIAGNOSIS — M79609 Pain in unspecified limb: Secondary | ICD-10-CM | POA: Diagnosis not present

## 2013-06-22 DIAGNOSIS — I8310 Varicose veins of unspecified lower extremity with inflammation: Secondary | ICD-10-CM | POA: Diagnosis not present

## 2013-12-31 ENCOUNTER — Other Ambulatory Visit: Payer: Self-pay

## 2014-03-01 ENCOUNTER — Other Ambulatory Visit: Payer: Self-pay | Admitting: Physician Assistant

## 2014-03-01 ENCOUNTER — Other Ambulatory Visit: Payer: Self-pay | Admitting: Family Medicine

## 2014-03-23 ENCOUNTER — Telehealth: Payer: Self-pay

## 2014-03-23 MED ORDER — LEVOTHYROXINE SODIUM 112 MCG PO TABS: 112.0000 ug | ORAL_TABLET | Freq: Every day | ORAL | Status: DC

## 2014-03-23 NOTE — Telephone Encounter (Signed)
Pt is scheduled for a CPE with Dr. Marin Comment on 04/29/14, but states will be out of her thyroid medication. Pt wants to know if rx can be refilled, until she has her CPE.

## 2014-03-23 NOTE — Telephone Encounter (Signed)
Spoke with pt. She just needs one more RF. Done. Advised to keep appt with Dr. Marin Comment. She states she will

## 2014-04-21 ENCOUNTER — Ambulatory Visit (INDEPENDENT_AMBULATORY_CARE_PROVIDER_SITE_OTHER): Payer: Medicare Other | Admitting: Family Medicine

## 2014-04-21 ENCOUNTER — Ambulatory Visit (INDEPENDENT_AMBULATORY_CARE_PROVIDER_SITE_OTHER): Payer: Medicare Other

## 2014-04-21 VITALS — BP 178/78 | HR 66 | Temp 98.1°F | Resp 16 | Ht 67.0 in | Wt 195.0 lb

## 2014-04-21 DIAGNOSIS — M79641 Pain in right hand: Secondary | ICD-10-CM | POA: Diagnosis not present

## 2014-04-21 DIAGNOSIS — S60221A Contusion of right hand, initial encounter: Secondary | ICD-10-CM | POA: Diagnosis not present

## 2014-04-21 NOTE — Progress Notes (Signed)
Subjective:  74 year old lady who had the lid to a trash can slam onto her right hand 2 days ago. It is continued to hurt more. The pain has spread out from the actual point of impact. She had a bruise develop which is gone down.   Objective: Very tender on the right second and third metacarpals. The wrist and  MCP joint areas do not seem to be tender, just along the long bones.   Assessment:  contusion and pain right hand.   Plan: X-ray right hand  [facility] reading (PRIMARY) by  Dr. Linna Darner Normal hand   right hand pain secondary to contusion and subsequent inflammation of tendons on dorsum of hand  Plan:  increase the  Aleve that she takes 1 twice daily in the morning and evening to 2 twice daily in the morning and evening

## 2014-04-21 NOTE — Patient Instructions (Signed)
Take Aleve 2 pills twice daily with food  Ice  Return if worse

## 2014-04-29 ENCOUNTER — Ambulatory Visit (INDEPENDENT_AMBULATORY_CARE_PROVIDER_SITE_OTHER): Payer: Medicare Other | Admitting: Family Medicine

## 2014-04-29 ENCOUNTER — Encounter: Payer: Self-pay | Admitting: Family Medicine

## 2014-04-29 VITALS — BP 154/86 | HR 76 | Temp 98.2°F | Resp 16 | Ht 67.5 in | Wt 188.8 lb

## 2014-04-29 DIAGNOSIS — Z0000 Encounter for general adult medical examination without abnormal findings: Secondary | ICD-10-CM | POA: Diagnosis not present

## 2014-04-29 DIAGNOSIS — Z13220 Encounter for screening for lipoid disorders: Secondary | ICD-10-CM

## 2014-04-29 DIAGNOSIS — Z23 Encounter for immunization: Secondary | ICD-10-CM

## 2014-04-29 DIAGNOSIS — Z130 Encounter for screening for diseases of the blood and blood-forming organs and certain disorders involving the immune mechanism: Secondary | ICD-10-CM

## 2014-04-29 DIAGNOSIS — E039 Hypothyroidism, unspecified: Secondary | ICD-10-CM

## 2014-04-29 DIAGNOSIS — Z1211 Encounter for screening for malignant neoplasm of colon: Secondary | ICD-10-CM

## 2014-04-29 LAB — COMPLETE METABOLIC PANEL WITHOUT GFR
ALT: 74 U/L — ABNORMAL HIGH (ref 0–35)
AST: 49 U/L — ABNORMAL HIGH (ref 0–37)
Albumin: 4.3 g/dL (ref 3.5–5.2)
Calcium: 9.7 mg/dL (ref 8.4–10.5)
Chloride: 100 meq/L (ref 96–112)
Potassium: 4.8 meq/L (ref 3.5–5.3)
Sodium: 135 meq/L (ref 135–145)

## 2014-04-29 LAB — CBC
HCT: 41.6 % (ref 36.0–46.0)
Hemoglobin: 13.7 g/dL (ref 12.0–15.0)
MCH: 30 pg (ref 26.0–34.0)
MCHC: 32.9 g/dL (ref 30.0–36.0)
MCV: 91.2 fL (ref 78.0–100.0)
MPV: 6 fL — ABNORMAL LOW (ref 8.6–12.4)
Platelets: 297 10*3/uL (ref 150–400)
RBC: 4.56 MIL/uL (ref 3.87–5.11)
RDW: 14.6 % (ref 11.5–15.5)
WBC: 5.5 10*3/uL (ref 4.0–10.5)

## 2014-04-29 LAB — LIPID PANEL
Cholesterol: 308 mg/dL — ABNORMAL HIGH (ref 0–200)
HDL: 62 mg/dL (ref >39)
LDL Cholesterol: 216 mg/dL — ABNORMAL HIGH (ref 0–99)
Total CHOL/HDL Ratio: 5 ratio
Triglycerides: 149 mg/dL (ref <150)
VLDL: 30 mg/dL (ref 0–40)

## 2014-04-29 LAB — COMPLETE METABOLIC PANEL WITH GFR
Alkaline Phosphatase: 79 U/L (ref 39–117)
BUN: 12 mg/dL (ref 6–23)
CO2: 26 mEq/L (ref 19–32)
Creat: 0.65 mg/dL (ref 0.50–1.10)
GFR, Est African American: >89 mL/min
GFR, Est Non African American: >89 mL/min
Glucose, Bld: 111 mg/dL — ABNORMAL HIGH (ref 70–99)
Total Bilirubin: 0.5 mg/dL (ref 0.2–1.2)
Total Protein: 8 g/dL (ref 6.0–8.3)

## 2014-04-29 LAB — TSH: TSH: 3.09 u[IU]/mL (ref 0.350–4.500)

## 2014-04-29 MED ORDER — VALACYCLOVIR HCL 1 G PO TABS: ORAL_TABLET | ORAL | Status: DC

## 2014-04-29 MED ORDER — RIZATRIPTAN BENZOATE 5 MG PO TABS: 5.0000 mg | ORAL_TABLET | ORAL | Status: DC | PRN

## 2014-04-29 MED ORDER — PNEUMOCOCCAL 13-VAL CONJ VACC IM SUSP
0.5000 mL | Freq: Once | INTRAMUSCULAR | Status: AC
  Administered 2014-04-29: 0.5 mL via INTRAMUSCULAR

## 2014-04-29 NOTE — Progress Notes (Signed)
Chief Complaint:  Chief Complaint  Patient presents with  . Annual Exam    with refills - Maxalt and Valtrex    HPI: Kelsey Warner is a 74 y.o. female who is here for  Annual exam She is doing well. No issues.  She is UTD on colonscopy 2011, resutls below, she does not want to get anymore coonscopies.  She is scheduled for mammogran.  She has had pevlic and pap a long time ago and does not want one, she has never any abnormal paps.  She would like Prevnar. Has never had any abnormal paps or mammogram    2011 Colonscopy  COMPLICATIONS: None  ENDOSCOPIC IMPRESSION:  1) Sessile polyp  2) Mild diverticulosis in the sigmoid colon  3) Internal hemorrhoids  4) Otherwise normal examination  RECOMMENDATIONS:  1) Await pathology results  2) High fiber diet.  Anusol HC supp 1 hs, #12, 3 refills, if no improvement in  bleeding, consider band ligation  REPEAT EXAM: In 10 year(s) for.   Cardiologist: Harrington Challenger Endocrinologist: Forde Dandy Gastroenterologist: Olevia Perches Orthopedist: Ginger Blue Pap Smear 2001, 2006 Mammogram 2002, 2004, 2005, 2006,  Td 07/13/04, 2012 Pneumococcal 2003 and also repeat 2012-2013 Shingles Vaccine 06/2008  Past Medical History  Diagnosis Date  . Hyperlipidemia   . Neuromuscular disorder   . Thyroid disease     hypothyroid  . Fibromyalgia   . Osteopenia   . Migraine   . Varicose veins   . Elevated LFTs    Past Surgical History  Procedure Laterality Date  . Appendectomy    . Tonsillectomy    . Tubal ligation    . Breast surgery    . Rotator cuff repair     History   Social History  . Marital Status: Widowed    Spouse Name: N/A  . Number of Children: 3  . Years of Education: N/A   Occupational History  . [employer].    Social History Main Topics  . Smoking status: Former Smoker -- 0.80 packs/day for 20 years    Types: Cigarettes    Quit date: 03/18/1977  . Smokeless tobacco: Not on  file  . Alcohol Use: Not on file  . Drug Use: Not on file  . Sexual Activity: Not on file   Other Topics Concern  . None   Social History Narrative   Pt lives alone.    Family History  Problem Relation Age of Onset  . Pulmonary fibrosis Father   . Heart disease Father   . Hyperlipidemia Father   . Pulmonary fibrosis Paternal Uncle   . Heart disease Maternal Grandmother   . Diabetes Maternal Grandmother   . Hypertension Maternal Grandmother   . Stroke Maternal Grandmother   . Mental illness Mother   . Mental illness Sister   . Cancer Sister   . COPD Sister    Allergies  Allergen Reactions  . Latex     Also allergic to Roxolox  . Penicillins     REACTION: hives, rash  . Statins    Prior to Admission medications   Medication Sig Start Date End Date Taking? Authorizing Provider  aspirin 81 MG tablet Take 81 mg by mouth daily.     Yes Historical Provider, MD  Cholecalciferol (VITAMIN D-3 PO) Take by mouth. TAKE 1 TAB DAILY    Yes Historical Provider, MD  levothyroxine (SYNTHROID, LEVOTHROID) 112 MCG tablet Take 1 tablet (112 mcg total) by mouth daily before breakfast. 03/23/14  Yes Shenika Quint P Quinley Nesler, DO  OVER THE COUNTER MEDICATION FISH OIL  2 TABS DAILY    Yes Historical Provider, MD  OVER THE COUNTER MEDICATION LEG CRAMP PILLS 1 TAB DAILY    Yes Historical Provider, MD  pyridOXINE (VITAMIN B-6) 100 MG tablet Take 100 mg by mouth daily.   Yes Historical Provider, MD  rizatriptan (MAXALT) 5 MG tablet Take 1 tablet (5 mg total) by mouth as needed for migraine. May repeat in 2 hours if needed 03/02/13  Yes Posey Boyer, MD  valACYclovir (VALTREX) 1000 MG tablet TAKE 2 TABLETS BY MOUTH TWICE DAILY FOR HERPES SIMPLEX ON LIP AS DIRECTED 11/07/11  Yes Ryan M Dunn, PA-C  vitamin B-12 (CYANOCOBALAMIN) 1000 MCG tablet Take 1,000 mcg by mouth daily.     Yes Historical Provider, MD     ROS: The patient denies fevers, chills, night sweats, unintentional weight loss, chest pain, palpitations,  wheezing, dyspnea on exertion, nausea, vomiting, abdominal pain, dysuria, hematuria, melena, numbness, weakness, or tingling.   All other systems have been reviewed and were otherwise negative with the exception of those mentioned in the HPI and as above.    PHYSICAL EXAM: Filed Vitals:   04/29/14 0911  BP: 154/86  Pulse: 76  Temp: 98.2 F (36.8 C)  Resp: 16   Filed Vitals:   04/29/14 0911  Height: 5' 7.5" (1.715 m)  Weight: 188 lb 12.8 oz (85.639 kg)   Body mass index is 29.12 kg/(m^2).  General: Alert, no acute distress HEENT:  Normocephalic, atraumatic, oropharynx patent. EOMI, , fundo nl, tm nl Cardiovascular:  Regular rate and rhythm, no rubs murmurs or gallops.  No Carotid bruits, radial pulse intact. No pedal edema.  Respiratory: Clear to auscultation bilaterally.  No wheezes, rales, or rhonchi.  No cyanosis, no use of accessory musculature GI: No organomegaly, abdomen is soft and non-tender, positive bowel sounds.  No masses. Skin: No rashes. Neurologic: Facial musculature symmetric. Psychiatric: Patient is appropriate throughout our interaction. Lymphatic: No cervical lymphadenopathy Musculoskeletal: Gait intact. 5.5 strenght, 2/2 DTRS Breast exam was normal GU deferred  LABS: Results for orders placed or performed in visit on 03/02/13  TSH  Result Value Ref Range   TSH 2.237 0.350 - 4.500 uIU/mL     EKG/XRAY:   Primary read interpreted by Dr. Marin Comment at [facility].   ASSESSMENT/PLAN: Encounter Diagnoses  Name Primary?  . Annual physical exam Yes  . Hypothyroidism, unspecified hypothyroidism type   . Screening for deficiency anemia   . Screening for hyperlipidemia   . Special screening for malignant neoplasms, colon    74 y.o female who is here for annual, declines pap and colonscopies Labs pending F/u in 6-12  months for hypothyroid recheck otherwise prn  Gross sideeffects, risk and benefits, and alternatives of medications d/w patient. Patient is aware that  all medications have potential sideeffects and we are unable to predict every sideeffect or drug-drug interaction that may occur.  Athenia Rys, [place], DO 04/29/2014 10:10 AM

## 2014-05-03 ENCOUNTER — Other Ambulatory Visit: Payer: Self-pay | Admitting: Family Medicine

## 2014-05-03 LAB — IFOBT (OCCULT BLOOD): IFOBT: POSITIVE

## 2014-05-03 MED ORDER — LEVOTHYROXINE SODIUM 112 MCG PO TABS: 112.0000 ug | ORAL_TABLET | Freq: Every day | ORAL | Status: DC

## 2014-05-06 ENCOUNTER — Encounter: Payer: Self-pay | Admitting: Family Medicine

## 2014-05-20 ENCOUNTER — Other Ambulatory Visit: Payer: Self-pay

## 2014-05-20 DIAGNOSIS — Z1231 Encounter for screening mammogram for malignant neoplasm of breast: Secondary | ICD-10-CM

## 2014-05-26 ENCOUNTER — Ambulatory Visit
Admission: RE | Admit: 2014-05-26 | Discharge: 2014-05-26 | Disposition: A | Discharge status: Home / Self Care | Payer: Medicare Other | Source: Ambulatory Visit

## 2014-05-26 ENCOUNTER — Other Ambulatory Visit: Payer: Self-pay | Admitting: Family Medicine

## 2014-05-26 DIAGNOSIS — Z1231 Encounter for screening mammogram for malignant neoplasm of breast: Secondary | ICD-10-CM | POA: Diagnosis not present

## 2014-05-26 DIAGNOSIS — Z1211 Encounter for screening for malignant neoplasm of colon: Secondary | ICD-10-CM

## 2014-06-02 DIAGNOSIS — I83813 Varicose veins of bilateral lower extremities with pain: Secondary | ICD-10-CM | POA: Diagnosis not present

## 2014-06-08 ENCOUNTER — Other Ambulatory Visit (INDEPENDENT_AMBULATORY_CARE_PROVIDER_SITE_OTHER): Payer: Medicare Other | Admitting: *Deleted

## 2014-06-08 DIAGNOSIS — Z1211 Encounter for screening for malignant neoplasm of colon: Secondary | ICD-10-CM | POA: Diagnosis not present

## 2014-06-08 DIAGNOSIS — I87323 Chronic venous hypertension (idiopathic) with inflammation of bilateral lower extremity: Secondary | ICD-10-CM | POA: Diagnosis not present

## 2014-06-08 LAB — IFOBT (OCCULT BLOOD): IFOBT: NEGATIVE

## 2014-06-20 ENCOUNTER — Encounter: Payer: Self-pay | Admitting: Internal Medicine

## 2014-08-25 ENCOUNTER — Encounter: Payer: Self-pay | Admitting: Internal Medicine

## 2014-10-31 ENCOUNTER — Other Ambulatory Visit: Payer: Self-pay | Admitting: Family Medicine

## 2015-04-20 ENCOUNTER — Other Ambulatory Visit: Payer: Self-pay

## 2015-04-20 DIAGNOSIS — Z1231 Encounter for screening mammogram for malignant neoplasm of breast: Secondary | ICD-10-CM

## 2015-04-21 ENCOUNTER — Other Ambulatory Visit: Payer: Self-pay | Admitting: Family Medicine

## 2015-05-01 ENCOUNTER — Other Ambulatory Visit: Payer: Self-pay | Admitting: Family Medicine

## 2015-05-29 ENCOUNTER — Ambulatory Visit
Admission: RE | Admit: 2015-05-29 | Discharge: 2015-05-29 | Disposition: A | Discharge status: Home / Self Care | Payer: Medicare Other | Source: Ambulatory Visit

## 2015-05-29 DIAGNOSIS — Z1231 Encounter for screening mammogram for malignant neoplasm of breast: Secondary | ICD-10-CM | POA: Diagnosis not present

## 2015-07-17 ENCOUNTER — Other Ambulatory Visit: Payer: Self-pay | Admitting: Physician Assistant

## 2015-07-28 ENCOUNTER — Other Ambulatory Visit: Payer: Self-pay | Admitting: Physician Assistant

## 2015-07-31 ENCOUNTER — Other Ambulatory Visit: Payer: Self-pay | Admitting: Physician Assistant

## 2015-08-01 NOTE — Telephone Encounter (Signed)
Patient states she was NOT informed she needed an OV for her refills.   She will call and schedule a CPE.  Meanwhile she is out of medication for her levothyroxine (SYNTHROID, LEVOTHROID) 112 MCG tablet

## 2015-08-10 ENCOUNTER — Other Ambulatory Visit: Payer: Self-pay

## 2015-08-10 NOTE — Telephone Encounter (Signed)
Patient has made an Annual Physical with Chelle on 09/12/2015 @ 9:00 am, but will run out of medication before the appointment and wanted to know if we could refilled up to the physical date.  levothyroxine Wilmer Floor, LEVOTHROID) 112 MCG tablet [id number]   Pharmacy:  [id number] - , [place] AT [facility] [place]  Patient # [id number]

## 2015-08-11 MED ORDER — LEVOTHYROXINE SODIUM 112 MCG PO TABS: ORAL_TABLET | ORAL | Status: DC

## 2015-08-11 NOTE — Telephone Encounter (Signed)
Meds ordered this encounter  Medications  . levothyroxine (SYNTHROID, LEVOTHROID) 112 MCG tablet    Sig: TAKE 1 TABLET BY MOUTH EVERY DAY BEFORE BREAKFAST    Dispense:  90 tablet    Refill:  0    **Patient requests 90 days supply**

## 2015-08-15 NOTE — Telephone Encounter (Signed)
Notified pt RF was sent. 

## 2015-09-12 ENCOUNTER — Encounter: Payer: Self-pay | Admitting: Physician Assistant

## 2015-09-12 ENCOUNTER — Ambulatory Visit (INDEPENDENT_AMBULATORY_CARE_PROVIDER_SITE_OTHER): Payer: Medicare Other | Admitting: Physician Assistant

## 2015-09-12 VITALS — BP 140/82 | HR 80 | Temp 97.9°F | Resp 16 | Ht 67.5 in | Wt 183.4 lb

## 2015-09-12 DIAGNOSIS — Z130 Encounter for screening for diseases of the blood and blood-forming organs and certain disorders involving the immune mechanism: Secondary | ICD-10-CM

## 2015-09-12 DIAGNOSIS — Z1211 Encounter for screening for malignant neoplasm of colon: Secondary | ICD-10-CM | POA: Diagnosis not present

## 2015-09-12 DIAGNOSIS — G43909 Migraine, unspecified, not intractable, without status migrainosus: Secondary | ICD-10-CM | POA: Insufficient documentation

## 2015-09-12 DIAGNOSIS — E038 Other specified hypothyroidism: Secondary | ICD-10-CM | POA: Diagnosis not present

## 2015-09-12 DIAGNOSIS — E2839 Other primary ovarian failure: Secondary | ICD-10-CM | POA: Diagnosis not present

## 2015-09-12 DIAGNOSIS — I8390 Asymptomatic varicose veins of unspecified lower extremity: Secondary | ICD-10-CM

## 2015-09-12 DIAGNOSIS — I868 Varicose veins of other specified sites: Secondary | ICD-10-CM | POA: Diagnosis not present

## 2015-09-12 DIAGNOSIS — M8580 Other specified disorders of bone density and structure, unspecified site: Secondary | ICD-10-CM

## 2015-09-12 DIAGNOSIS — R0600 Dyspnea, unspecified: Secondary | ICD-10-CM | POA: Diagnosis not present

## 2015-09-12 DIAGNOSIS — G629 Polyneuropathy, unspecified: Secondary | ICD-10-CM | POA: Insufficient documentation

## 2015-09-12 DIAGNOSIS — G609 Hereditary and idiopathic neuropathy, unspecified: Secondary | ICD-10-CM | POA: Diagnosis not present

## 2015-09-12 DIAGNOSIS — G43809 Other migraine, not intractable, without status migrainosus: Secondary | ICD-10-CM | POA: Diagnosis not present

## 2015-09-12 DIAGNOSIS — M169 Osteoarthritis of hip, unspecified: Secondary | ICD-10-CM | POA: Insufficient documentation

## 2015-09-12 DIAGNOSIS — E785 Hyperlipidemia, unspecified: Secondary | ICD-10-CM | POA: Diagnosis not present

## 2015-09-12 DIAGNOSIS — B001 Herpesviral vesicular dermatitis: Secondary | ICD-10-CM

## 2015-09-12 DIAGNOSIS — Z13228 Encounter for screening for other metabolic disorders: Secondary | ICD-10-CM | POA: Diagnosis not present

## 2015-09-12 DIAGNOSIS — Z0000 Encounter for general adult medical examination without abnormal findings: Secondary | ICD-10-CM

## 2015-09-12 DIAGNOSIS — M160 Bilateral primary osteoarthritis of hip: Secondary | ICD-10-CM

## 2015-09-12 DIAGNOSIS — Z139 Encounter for screening, unspecified: Secondary | ICD-10-CM

## 2015-09-12 DIAGNOSIS — Z1389 Encounter for screening for other disorder: Secondary | ICD-10-CM

## 2015-09-12 DIAGNOSIS — M47816 Spondylosis without myelopathy or radiculopathy, lumbar region: Secondary | ICD-10-CM

## 2015-09-12 DIAGNOSIS — M62830 Muscle spasm of back: Secondary | ICD-10-CM | POA: Diagnosis not present

## 2015-09-12 DIAGNOSIS — Z6828 Body mass index (BMI) 28.0-28.9, adult: Secondary | ICD-10-CM | POA: Diagnosis not present

## 2015-09-12 LAB — POCT URINALYSIS DIP (MANUAL ENTRY)
Bilirubin, UA: NEGATIVE
Blood, UA: NEGATIVE
GLUCOSE UA: NEGATIVE
Ketones, POC UA: NEGATIVE
Leukocytes, UA: NEGATIVE
NITRITE UA: NEGATIVE
Protein Ur, POC: NEGATIVE
Spec Grav, UA: 1.02
UROBILINOGEN UA: 0.2
pH, UA: 7

## 2015-09-12 LAB — CBC WITH DIFFERENTIAL/PLATELET
Basophils Absolute: 58 cells/uL (ref 0–200)
Basophils Relative: 1 %
Eosinophils Absolute: 174 cells/uL (ref 15–500)
Eosinophils Relative: 3 %
HEMATOCRIT: 39.2 % (ref 35.0–45.0)
Hemoglobin: 13.1 g/dL (ref 11.7–15.5)
Lymphocytes Relative: 30 %
Lymphs Abs: 1740 cells/uL (ref 850–3900)
MCH: 30 pg (ref 27.0–33.0)
MCHC: 33.4 g/dL (ref 32.0–36.0)
MCV: 89.9 fL (ref 80.0–100.0)
MONOS PCT: 9 %
MPV: 9.2 fL (ref 7.5–12.5)
Monocytes Absolute: 522 cells/uL (ref 200–950)
Neutro Abs: 3306 cells/uL (ref 1500–7800)
Neutrophils Relative %: 57 %
PLATELETS: 336 10*3/uL (ref 140–400)
RBC: 4.36 MIL/uL (ref 3.80–5.10)
RDW: 14.1 % (ref 11.0–15.0)
WBC: 5.8 10*3/uL (ref 3.8–10.8)

## 2015-09-12 LAB — POC MICROSCOPIC URINALYSIS (UMFC): MUCUS RE: ABSENT

## 2015-09-12 MED ORDER — VALACYCLOVIR HCL 1 G PO TABS: ORAL_TABLET | ORAL | Status: DC

## 2015-09-12 MED ORDER — RIZATRIPTAN BENZOATE 5 MG PO TABS: 5.0000 mg | ORAL_TABLET | ORAL | Status: DC | PRN

## 2015-09-12 MED ORDER — CARISOPRODOL 250 MG PO TABS: 250.0000 mg | ORAL_TABLET | Freq: Every day | ORAL | Status: DC | PRN

## 2015-09-12 MED ORDER — PREGABALIN 50 MG PO CAPS: 50.0000 mg | ORAL_CAPSULE | Freq: Three times a day (TID) | ORAL | Status: DC

## 2015-09-12 NOTE — Patient Instructions (Signed)
     IF you received an x-ray today, you will receive an invoice from [facility]. Please contact [facility] at [phone] with questions or concerns regarding your invoice.   IF you received labwork today, you will receive an invoice from [facility]. Please contact [facility] at [phone] with questions or concerns regarding your invoice.   Our billing staff will not be able to assist you with questions regarding bills from these companies.  You will be contacted with the lab results as soon as they are available. The fastest way to get your results is to activate your My Chart account. Instructions are located on the last page of this paperwork. If you have not heard from us regarding the results in 2 weeks, please contact this office.      

## 2015-09-12 NOTE — Progress Notes (Signed)
Kelsey Warner  Aug 12, 1940 75 y.o. female  Presents today for [employer] Visit-Subsequent.  Care Team: PCP: Lou Miner, MD. Not seen by him in several years. Last Annual Exam with Dr. Marin Comment. Will see me going forward. Specialists:  GI: [facility] Cardiology: Ilda BassetNoemi Chapel Endocrinology: Forde Dandy Nutritionist: [place] Rheumatology: Charlestine Night  Other items to address today: back pain, muscle cramps, peripheral neuropathy. -intermittent back pain with certain activities (putting up corn, most recently). Spasms. x2 years. Took a dose of her son's Soma, which really helped. -Leg cramps at night. Applies topical Activon and then a heating pad. Otherwise she is so sore the next morning she can't walk. Often awakens her 3-4 times/night. Has had them for years, but they are getting worse. Previously took Quinine. OTC leg cramp product doesn't really seem to help. Tonic water has helped. -feet and legs are numb and they hurt. Believes that this is due to adverse effects of statin therapy. Was prescribed Lyrica, but never filled it, after reviewing the potential adverse effects. Is reconsidering trying it. Feels like wax had dried around the legs, like they are stationary and she can't move them.   Cancer Screening: Cervical: n/a Breast: yes Colon: yes, 2011 sessile polyp, mild sigmoid diverticulosis. Prostate: n/a    Immunization status: up to date and documented, missing doses of pneumovax 23 and Shingles, but patient believes she has actually received these.   ADVANCE DIRECTIVES: Discussed: yes On File: no Materials Provided: no, asked patient to bring copies.   Last screening for diabetes: 04/2014, normal Last EKG: 2012 with Dr. Harrington Challenger Last lipid screening: 04/2014, declines repeat testing today, as she will not take lipid-lowering medications.   Patient Active Problem List   Diagnosis Date Noted  . Migraine 09/12/2015  . Fever blister 09/12/2015  . BMI 28.0-28.9,adult  09/12/2015  . Hypothyroid 12/19/2011  . Dyspnea 01/02/2011     Past Medical History  Diagnosis Date  . Hyperlipidemia   . Neuromuscular disorder ([place])   . Thyroid disease     hypothyroid  . Fibromyalgia   . Osteopenia   . Migraine   . Varicose veins   . Elevated LFTs      Past Surgical History  Procedure Laterality Date  . Appendectomy    . Tonsillectomy    . Tubal ligation    . Breast surgery    . Rotator cuff repair       Family History  Problem Relation Age of Onset  . Pulmonary fibrosis Father   . Heart disease Father   . Hyperlipidemia Father   . Pulmonary fibrosis Paternal Uncle   . Heart disease Maternal Grandmother   . Diabetes Maternal Grandmother   . Hypertension Maternal Grandmother   . Stroke Maternal Grandmother   . Mental illness Mother   . Mental illness Sister   . Cancer Sister   . COPD Sister      Social History   Social History  . Marital Status: Widowed    Spouse Name: n/a  . Number of Children: 3  . Years of Education: 11th grade   Occupational History  . retired Web designer     2014   Social History Main Topics  . Smoking status: Former Smoker -- 0.80 packs/day for 20 years    Types: Cigarettes    Quit date: 03/18/1977  . Smokeless tobacco: Never Used  . Alcohol Use: No  . Drug Use: No  . Sexual Activity:    Partners: Male  Birth Control/ Protection: Post-menopausal   Other Topics Concern  . Not on file   Social History Narrative   Pt lives alone.    Her boyfriend lives in [place].   Her adult children live in [place] and [place].     Allergies  Allergen Reactions  . Adhesive [Tape]   . Penicillins     REACTION: hives, rash  . Statins Other (See Comments)    Tried multiple statins, all caused muscle aches. 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     Prior to Admission medications   Medication Sig Start Date End Date Taking? Authorizing Provider  aspirin 81 MG  tablet Take 81 mg by mouth daily.     Yes Historical Provider, MD  Cholecalciferol (VITAMIN D-3 PO) Take by mouth. TAKE 1 TAB DAILY    Yes Historical Provider, MD  KRILL OIL PO Take 1 tablet by mouth.   Yes Historical Provider, MD  levothyroxine (SYNTHROID, LEVOTHROID) 112 MCG tablet TAKE 1 TABLET BY MOUTH EVERY DAY BEFORE BREAKFAST 08/11/15  Yes Lindsay Straka, PA-C  MAGNESIUM PO Take 1 capsule by mouth.   Yes Historical Provider, MD  OVER THE COUNTER MEDICATION LEG CRAMP PILLS 1 TAB DAILY    Yes Historical Provider, MD  pyridOXINE (VITAMIN B-6) 100 MG tablet Take 100 mg by mouth daily.   Yes Historical Provider, MD  rizatriptan (MAXALT) 5 MG tablet Take 1 tablet (5 mg total) by mouth as needed for migraine. May repeat in 2 hours if needed 04/29/14  Yes Thao P Le, DO  valACYclovir (VALTREX) 1000 MG tablet TAKE 2 TABLETS BY MOUTH TWICE DAILY FOR HERPES SIMPLEX ON LIP AS DIRECTED 04/29/14  Yes Thao P Le, DO  vitamin B-12 (CYANOCOBALAMIN) 1000 MCG tablet Take 1,000 mcg by mouth daily.     Yes Historical Provider, MD     Depression screen [facility] 2/9 09/12/2015 04/29/2014  Decreased Interest 0 0  Down, Depressed, Hopeless 0 0  PHQ - 2 Score 0 0     Fall Risk  09/12/2015 04/29/2014  Falls in the past year? No No    Functional Status Survey: Is the patient deaf or have difficulty hearing?: No Does the patient have difficulty seeing, even when wearing glasses/contacts?: No Does the patient have difficulty concentrating, remembering, or making decisions?: No Does the patient have difficulty walking or climbing stairs?: No Does the patient have difficulty dressing or bathing?: No Does the patient have difficulty doing errands alone such as visiting a doctor's office or shopping?: No   PHYSICAL EXAM: Physical Exam  Constitutional: She is oriented to person, place, and time. She appears well-developed and well-nourished. She is active and cooperative. No distress.  BP 140/82 mmHg  Pulse 80   Temp(Src) 97.9 F (36.6 C) (Oral)  Resp 16  Ht 5' 7.5" (1.715 m)  Wt 183 lb 6.4 oz (83.19 kg)  BMI 28.28 kg/m2  SpO2 96%   HENT:  Head: Normocephalic and atraumatic.  Right Ear: Hearing normal.  Left Ear: Hearing normal.  Mouth/Throat: Uvula is midline, oropharynx is clear and moist and mucous membranes are normal. She has dentures (upper plate). No oral lesions. Normal dentition. No uvula swelling.  Eyes: Conjunctivae and EOM are normal. Pupils are equal, round, and reactive to light. No scleral icterus.  Neck: Phonation normal. Neck supple. No spinous process tenderness and no muscular tenderness present. No thyromegaly present.  Cardiovascular: Normal rate, regular rhythm, normal heart sounds and intact distal pulses.   Pulmonary/Chest: Effort  normal and breath sounds normal.  Abdominal: Soft. Bowel sounds are normal. There is no tenderness.  Lymphadenopathy:    She has no cervical adenopathy.  Neurological: She is alert and oriented to person, place, and time.  Skin: Skin is warm and dry. No rash noted.  Psychiatric: She has a normal mood and affect. Her speech is normal and behavior is normal. Judgment and thought content normal. Cognition and memory are normal.      No exam data present   BP 160/80 mmHg  Pulse 80  Temp(Src) 97.9 F (36.6 C) (Oral)  Resp 16  Ht 5' 7.5" (1.715 m)  Wt 183 lb 6.4 oz (83.19 kg)  BMI 28.28 kg/m2  SpO2 96%   Education/Counseling: yes diet and exercise yes prevention of chronic diseases yes smoking/tobacco cessation yes review "Covered Medicare Preventive Services"    ASSESSMENT/PLAN: 1. Medicare annual wellness visit, subsequent 2. Annual physical exam Age appropriate anticipatory guidance provided. Will obtain her paper record and review for vaccinations and screenings done prior to 04/16/2011 and not abstracted into the EMR.  3. Dyspnea Stable. Only problematic when she overdoes activity. Does not limit her activities.  4.  Other migraine without status migrainosus, not intractable Stable. Rare. - rizatriptan (MAXALT) 5 MG tablet; Take 1 tablet (5 mg total) by mouth as needed for migraine. May repeat in 2 hours if needed  Dispense: 5 tablet; Refill: 2  5. Fever blister Stable. Rare. - valACYclovir (VALTREX) 1000 MG tablet; TAKE 2 TABLETS BY MOUTH TWICE DAILY FOR HERPES SIMPLEX ON LIP AS DIRECTED  Dispense: 20 tablet; Refill: 2  6. BMI 28.0-28.9,adult Health eating and regular exercise.  7. Other specified hypothyroidism Update labs. Adjust levothyroxine dose if needed. - TSH  8. Screening for deficiency anemia - CBC with Differential/Platelet  9. Screening for metabolic disorder - Comprehensive metabolic panel  10. Screening for blood or protein in urine - POCT urinalysis dipstick - POCT Microscopic Urinalysis ([facility])  11. Screening for colon cancer She does not plan to have another colonoscopy, but she is willing to consider other screening methods. Refer to GI for discussion. - Ambulatory referral to Gastroenterology  12. Peripheral Neuropathy -Ready to reconsider Lyrica. Start with 50 mg TID. (Chart review reveals that the prescription was actually for gabapentin, which is an alternative to try if she doesn't like the Lyrica)fatty liver  13. Back spasms -Soma 250 mg 1 PO QD PRN.   Fara Chute, PA-C Physician Assistant-Certified [facility]

## 2015-09-13 LAB — COMPREHENSIVE METABOLIC PANEL
ALT: 31 U/L — ABNORMAL HIGH (ref 6–29)
AST: 23 U/L (ref 10–35)
Albumin: 4.2 g/dL (ref 3.6–5.1)
Alkaline Phosphatase: 77 U/L (ref 33–130)
BILIRUBIN TOTAL: 0.3 mg/dL (ref 0.2–1.2)
BUN: 12 mg/dL (ref 7–25)
CALCIUM: 9.8 mg/dL (ref 8.6–10.4)
CO2: 24 mmol/L (ref 20–31)
Chloride: 98 mmol/L (ref 98–110)
Creat: 0.69 mg/dL (ref 0.60–0.93)
Glucose, Bld: 107 mg/dL — ABNORMAL HIGH (ref 65–99)
Potassium: 4.7 mmol/L (ref 3.5–5.3)
SODIUM: 137 mmol/L (ref 135–146)
Total Protein: 7.5 g/dL (ref 6.1–8.1)

## 2015-09-13 LAB — TSH: TSH: 2.64 mIU/L

## 2015-09-29 DIAGNOSIS — M8589 Other specified disorders of bone density and structure, multiple sites: Secondary | ICD-10-CM | POA: Diagnosis not present

## 2015-09-29 LAB — HM DEXA SCAN

## 2015-10-15 ENCOUNTER — Encounter: Payer: Self-pay | Admitting: Physician Assistant

## 2015-10-15 DIAGNOSIS — M8580 Other specified disorders of bone density and structure, unspecified site: Secondary | ICD-10-CM

## 2015-12-30 ENCOUNTER — Other Ambulatory Visit: Payer: Self-pay | Admitting: Physician Assistant

## 2016-01-12 DIAGNOSIS — Z23 Encounter for immunization: Secondary | ICD-10-CM | POA: Diagnosis not present

## 2016-01-26 ENCOUNTER — Other Ambulatory Visit: Payer: Self-pay | Admitting: Physician Assistant

## 2016-01-26 DIAGNOSIS — G609 Hereditary and idiopathic neuropathy, unspecified: Secondary | ICD-10-CM

## 2016-01-26 NOTE — Telephone Encounter (Signed)
Meds ordered this encounter  Medications  . LYRICA 50 MG capsule    Sig: TAKE 1 CAPSULE BY MOUTH THREE TIMES DAILY    Dispense:  90 capsule    Refill:  0   Please advise patient she needs to establish with new PCP for additional refills

## 2016-01-26 NOTE — Telephone Encounter (Signed)
Last saw pt 6/27 and gave Rx for #90 + 3 RFs.

## 2016-01-26 NOTE — Telephone Encounter (Signed)
Faxed to [address]

## 2016-02-16 DIAGNOSIS — H1859 Other hereditary corneal dystrophies: Secondary | ICD-10-CM | POA: Diagnosis not present

## 2016-02-16 DIAGNOSIS — H2513 Age-related nuclear cataract, bilateral: Secondary | ICD-10-CM | POA: Diagnosis not present

## 2016-02-16 DIAGNOSIS — H43813 Vitreous degeneration, bilateral: Secondary | ICD-10-CM | POA: Diagnosis not present

## 2016-03-01 ENCOUNTER — Other Ambulatory Visit: Payer: Self-pay | Admitting: Physician Assistant

## 2016-03-01 DIAGNOSIS — G609 Hereditary and idiopathic neuropathy, unspecified: Secondary | ICD-10-CM

## 2016-03-01 NOTE — Telephone Encounter (Signed)
Last seen 6/27, last RF 11/10 #90.

## 2016-03-02 ENCOUNTER — Other Ambulatory Visit: Payer: Self-pay | Admitting: Physician Assistant

## 2016-03-02 DIAGNOSIS — G609 Hereditary and idiopathic neuropathy, unspecified: Secondary | ICD-10-CM

## 2016-03-02 MED ORDER — PREGABALIN 50 MG PO CAPS: ORAL_CAPSULE | ORAL | 0 refills | Status: DC

## 2016-03-02 NOTE — Telephone Encounter (Signed)
Meds ordered this encounter  Medications  . LYRICA 50 MG capsule    Sig: TAKE 1 CAPSULE THREE TIMES DAILY    Dispense:  90 capsule    Refill:  0

## 2016-03-02 NOTE — Telephone Encounter (Signed)
Pt called about this refill.  She is worried because she knows she is not supposed to just stop taking it and she is completely out.  [phone]

## 2016-03-04 NOTE — Telephone Encounter (Signed)
Called pt to make sure she was aware RF was sent Sat.

## 2016-03-18 DIAGNOSIS — I639 Cerebral infarction, unspecified: Secondary | ICD-10-CM

## 2016-03-18 HISTORY — DX: Cerebral infarction, unspecified: I63.9

## 2016-03-30 ENCOUNTER — Other Ambulatory Visit: Payer: Self-pay | Admitting: Physician Assistant

## 2016-03-30 DIAGNOSIS — G609 Hereditary and idiopathic neuropathy, unspecified: Secondary | ICD-10-CM

## 2016-03-31 NOTE — Telephone Encounter (Signed)
08/2015 last ov with chelle 02/2016 last refill [facility]

## 2016-04-01 NOTE — Telephone Encounter (Signed)
Called to pharm and pt tx up front for appt.

## 2016-04-01 NOTE — Telephone Encounter (Signed)
Meds ordered this encounter  Medications  . LYRICA 50 MG capsule    Sig: TAKE 1 CAPSULE BY MOUTH THREE TIMES DAILY    Dispense:  90 capsule    Refill:  0    Please advise patient she needs OV.

## 2016-04-09 ENCOUNTER — Ambulatory Visit: Payer: Medicare Other | Admitting: Physician Assistant

## 2016-04-09 ENCOUNTER — Ambulatory Visit (HOSPITAL_BASED_OUTPATIENT_CLINIC_OR_DEPARTMENT_OTHER): Payer: Medicare Other | Admitting: Certified Registered"

## 2016-04-09 ENCOUNTER — Ambulatory Visit (HOSPITAL_BASED_OUTPATIENT_CLINIC_OR_DEPARTMENT_OTHER): Admit: 2016-04-09 | Payer: Medicare Other | Admitting: Otolaryngology

## 2016-04-09 ENCOUNTER — Encounter (HOSPITAL_COMMUNITY): Payer: Self-pay

## 2016-04-09 ENCOUNTER — Emergency Department (HOSPITAL_COMMUNITY): Payer: Medicare Other

## 2016-04-09 ENCOUNTER — Ambulatory Visit (HOSPITAL_COMMUNITY)
Admission: EM | Admit: 2016-04-09 | Discharge: 2016-04-09 | Disposition: A | Discharge status: Home / Self Care | Payer: Medicare Other | Source: Ambulatory Visit | Attending: Otolaryngology | Admitting: Otolaryngology

## 2016-04-09 ENCOUNTER — Encounter (HOSPITAL_BASED_OUTPATIENT_CLINIC_OR_DEPARTMENT_OTHER): Admission: EM | Disposition: A | Discharge status: Home / Self Care | Payer: Self-pay | Source: Ambulatory Visit

## 2016-04-09 DIAGNOSIS — S0280XB Fracture of other specified skull and facial bones, unspecified side, initial encounter for open fracture: Secondary | ICD-10-CM | POA: Diagnosis not present

## 2016-04-09 DIAGNOSIS — J8410 Pulmonary fibrosis, unspecified: Secondary | ICD-10-CM | POA: Diagnosis not present

## 2016-04-09 DIAGNOSIS — Z880 Allergy status to penicillin: Secondary | ICD-10-CM | POA: Diagnosis not present

## 2016-04-09 DIAGNOSIS — K429 Umbilical hernia without obstruction or gangrene: Secondary | ICD-10-CM | POA: Diagnosis not present

## 2016-04-09 DIAGNOSIS — T170XXA Foreign body in nasal sinus, initial encounter: Secondary | ICD-10-CM

## 2016-04-09 DIAGNOSIS — Z87891 Personal history of nicotine dependence: Secondary | ICD-10-CM | POA: Diagnosis not present

## 2016-04-09 DIAGNOSIS — G629 Polyneuropathy, unspecified: Secondary | ICD-10-CM | POA: Diagnosis not present

## 2016-04-09 DIAGNOSIS — M1990 Unspecified osteoarthritis, unspecified site: Secondary | ICD-10-CM | POA: Diagnosis not present

## 2016-04-09 DIAGNOSIS — S0181XA Laceration without foreign body of other part of head, initial encounter: Secondary | ICD-10-CM | POA: Diagnosis present

## 2016-04-09 DIAGNOSIS — Z79899 Other long term (current) drug therapy: Secondary | ICD-10-CM | POA: Insufficient documentation

## 2016-04-09 DIAGNOSIS — S01511A Laceration without foreign body of lip, initial encounter: Secondary | ICD-10-CM | POA: Diagnosis not present

## 2016-04-09 DIAGNOSIS — Y939 Activity, unspecified: Secondary | ICD-10-CM | POA: Diagnosis not present

## 2016-04-09 DIAGNOSIS — M797 Fibromyalgia: Secondary | ICD-10-CM | POA: Insufficient documentation

## 2016-04-09 DIAGNOSIS — S0003XA Contusion of scalp, initial encounter: Secondary | ICD-10-CM | POA: Insufficient documentation

## 2016-04-09 DIAGNOSIS — W01110A Fall on same level from slipping, tripping and stumbling with subsequent striking against sharp glass, initial encounter: Secondary | ICD-10-CM | POA: Insufficient documentation

## 2016-04-09 DIAGNOSIS — W19XXXA Unspecified fall, initial encounter: Secondary | ICD-10-CM

## 2016-04-09 DIAGNOSIS — I8390 Asymptomatic varicose veins of unspecified lower extremity: Secondary | ICD-10-CM | POA: Diagnosis not present

## 2016-04-09 DIAGNOSIS — Z7982 Long term (current) use of aspirin: Secondary | ICD-10-CM | POA: Insufficient documentation

## 2016-04-09 DIAGNOSIS — M8580 Other specified disorders of bone density and structure, unspecified site: Secondary | ICD-10-CM | POA: Diagnosis not present

## 2016-04-09 DIAGNOSIS — S0219XA Other fracture of base of skull, initial encounter for closed fracture: Secondary | ICD-10-CM | POA: Diagnosis not present

## 2016-04-09 DIAGNOSIS — Y92009 Unspecified place in unspecified non-institutional (private) residence as the place of occurrence of the external cause: Secondary | ICD-10-CM

## 2016-04-09 DIAGNOSIS — W1789XA Other fall from one level to another, initial encounter: Secondary | ICD-10-CM | POA: Diagnosis not present

## 2016-04-09 DIAGNOSIS — Z91048 Other nonmedicinal substance allergy status: Secondary | ICD-10-CM | POA: Insufficient documentation

## 2016-04-09 DIAGNOSIS — S01421A Laceration with foreign body of right cheek and temporomandibular area, initial encounter: Secondary | ICD-10-CM | POA: Diagnosis not present

## 2016-04-09 DIAGNOSIS — S0240CA Maxillary fracture, right side, initial encounter for closed fracture: Secondary | ICD-10-CM | POA: Insufficient documentation

## 2016-04-09 DIAGNOSIS — G43909 Migraine, unspecified, not intractable, without status migrainosus: Secondary | ICD-10-CM | POA: Insufficient documentation

## 2016-04-09 DIAGNOSIS — K449 Diaphragmatic hernia without obstruction or gangrene: Secondary | ICD-10-CM | POA: Diagnosis not present

## 2016-04-09 DIAGNOSIS — S199XXA Unspecified injury of neck, initial encounter: Secondary | ICD-10-CM | POA: Diagnosis not present

## 2016-04-09 DIAGNOSIS — K760 Fatty (change of) liver, not elsewhere classified: Secondary | ICD-10-CM | POA: Insufficient documentation

## 2016-04-09 DIAGNOSIS — E785 Hyperlipidemia, unspecified: Secondary | ICD-10-CM | POA: Diagnosis not present

## 2016-04-09 DIAGNOSIS — Z888 Allergy status to other drugs, medicaments and biological substances status: Secondary | ICD-10-CM | POA: Diagnosis not present

## 2016-04-09 DIAGNOSIS — R404 Transient alteration of awareness: Secondary | ICD-10-CM | POA: Diagnosis not present

## 2016-04-09 DIAGNOSIS — E039 Hypothyroidism, unspecified: Secondary | ICD-10-CM | POA: Insufficient documentation

## 2016-04-09 DIAGNOSIS — R51 Headache: Secondary | ICD-10-CM | POA: Diagnosis not present

## 2016-04-09 DIAGNOSIS — S0101XA Laceration without foreign body of scalp, initial encounter: Secondary | ICD-10-CM | POA: Insufficient documentation

## 2016-04-09 DIAGNOSIS — Z1881 Retained glass fragments: Secondary | ICD-10-CM | POA: Diagnosis not present

## 2016-04-09 DIAGNOSIS — S0990XA Unspecified injury of head, initial encounter: Secondary | ICD-10-CM | POA: Diagnosis not present

## 2016-04-09 DIAGNOSIS — R55 Syncope and collapse: Secondary | ICD-10-CM | POA: Diagnosis not present

## 2016-04-09 DIAGNOSIS — S0182XA Laceration with foreign body of other part of head, initial encounter: Secondary | ICD-10-CM | POA: Diagnosis not present

## 2016-04-09 DIAGNOSIS — S3991XA Unspecified injury of abdomen, initial encounter: Secondary | ICD-10-CM | POA: Diagnosis not present

## 2016-04-09 DIAGNOSIS — S0281XB Fracture of other specified skull and facial bones, right side, initial encounter for open fracture: Secondary | ICD-10-CM | POA: Diagnosis present

## 2016-04-09 DIAGNOSIS — S0285XB Fracture of orbit, unspecified, initial encounter for open fracture: Secondary | ICD-10-CM

## 2016-04-09 DIAGNOSIS — S0085XA Superficial foreign body of other part of head, initial encounter: Secondary | ICD-10-CM | POA: Diagnosis not present

## 2016-04-09 DIAGNOSIS — S0281XA Fracture of other specified skull and facial bones, right side, initial encounter for closed fracture: Secondary | ICD-10-CM | POA: Insufficient documentation

## 2016-04-09 DIAGNOSIS — S0231XA Fracture of orbital floor, right side, initial encounter for closed fracture: Secondary | ICD-10-CM | POA: Diagnosis not present

## 2016-04-09 DIAGNOSIS — S01111A Laceration without foreign body of right eyelid and periocular area, initial encounter: Secondary | ICD-10-CM | POA: Diagnosis not present

## 2016-04-09 HISTORY — PX: FOREIGN BODY REMOVAL: SHX962

## 2016-04-09 LAB — CBC WITH DIFFERENTIAL/PLATELET
Basophils Absolute: 0 10*3/uL (ref 0.0–0.1)
Basophils Relative: 0 %
EOS ABS: 0.1 10*3/uL (ref 0.0–0.7)
Eosinophils Relative: 1 %
HCT: 36 % (ref 36.0–46.0)
HEMOGLOBIN: 12.1 g/dL (ref 12.0–15.0)
LYMPHS ABS: 0.6 10*3/uL — AB (ref 0.7–4.0)
Lymphocytes Relative: 7 %
MCH: 30.2 pg (ref 26.0–34.0)
MCHC: 33.6 g/dL (ref 30.0–36.0)
MCV: 89.8 fL (ref 78.0–100.0)
MONOS PCT: 6 %
Monocytes Absolute: 0.5 10*3/uL (ref 0.1–1.0)
NEUTROS ABS: 7.4 10*3/uL (ref 1.7–7.7)
NEUTROS PCT: 86 %
Platelets: 216 10*3/uL (ref 150–400)
RBC: 4.01 MIL/uL (ref 3.87–5.11)
RDW: 13.5 % (ref 11.5–15.5)
WBC: 8.6 10*3/uL (ref 4.0–10.5)

## 2016-04-09 LAB — BASIC METABOLIC PANEL
Anion gap: 9 (ref 5–15)
BUN: 11 mg/dL (ref 6–20)
CHLORIDE: 102 mmol/L (ref 101–111)
CO2: 23 mmol/L (ref 22–32)
Calcium: 9 mg/dL (ref 8.9–10.3)
Creatinine, Ser: 0.79 mg/dL (ref 0.44–1.00)
GFR calc Af Amer: >60 mL/min (ref >60)
GFR calc non Af Amer: >60 mL/min (ref >60)
Glucose, Bld: 139 mg/dL — ABNORMAL HIGH (ref 65–99)
POTASSIUM: 3.9 mmol/L (ref 3.5–5.1)
SODIUM: 134 mmol/L — AB (ref 135–145)

## 2016-04-09 LAB — I-STAT CG4 LACTIC ACID, ED: Lactic Acid, Venous: 1.03 mmol/L (ref 0.5–1.9)

## 2016-04-09 SURGERY — REMOVAL, FOREIGN BODY
Anesthesia: Monitor Anesthesia Care | Site: Face | Laterality: Right

## 2016-04-09 MED ORDER — ONDANSETRON HCL 4 MG/2ML IJ SOLN
4.0000 mg | Freq: Once | INTRAMUSCULAR | Status: AC
  Administered 2016-04-09: 4 mg via INTRAVENOUS
  Filled 2016-04-09: qty 2

## 2016-04-09 MED ORDER — MORPHINE SULFATE (PF) 4 MG/ML IV SOLN
4.0000 mg | Freq: Once | INTRAVENOUS | Status: AC
  Administered 2016-04-09: 4 mg via INTRAVENOUS

## 2016-04-09 MED ORDER — CLINDAMYCIN PHOSPHATE 600 MG/50ML IV SOLN
INTRAVENOUS | Status: AC
  Filled 2016-04-09: qty 50

## 2016-04-09 MED ORDER — PROMETHAZINE HCL 25 MG/ML IJ SOLN: 6.2500 mg | INTRAMUSCULAR | Status: DC | PRN

## 2016-04-09 MED ORDER — LIDOCAINE 2% (20 MG/ML) 5 ML SYRINGE
INTRAMUSCULAR | Status: AC
  Filled 2016-04-09: qty 5

## 2016-04-09 MED ORDER — IOPAMIDOL (ISOVUE-300) INJECTION 61%
INTRAVENOUS | Status: AC
  Administered 2016-04-09: 100 mL
  Filled 2016-04-09: qty 100

## 2016-04-09 MED ORDER — HYDROMORPHONE HCL 1 MG/ML IJ SOLN: 0.2500 mg | INTRAMUSCULAR | Status: DC | PRN

## 2016-04-09 MED ORDER — ONDANSETRON HCL 4 MG/2ML IJ SOLN
INTRAMUSCULAR | Status: AC
  Filled 2016-04-09: qty 2

## 2016-04-09 MED ORDER — MORPHINE SULFATE (PF) 4 MG/ML IV SOLN
INTRAVENOUS | Status: AC
  Filled 2016-04-09: qty 1

## 2016-04-09 MED ORDER — OXYCODONE-ACETAMINOPHEN 5-325 MG PO TABS: 1.0000 | ORAL_TABLET | ORAL | 0 refills | Status: DC | PRN

## 2016-04-09 MED ORDER — MORPHINE SULFATE (PF) 4 MG/ML IV SOLN
4.0000 mg | Freq: Once | INTRAVENOUS | Status: AC
  Administered 2016-04-09: 4 mg via INTRAVENOUS
  Filled 2016-04-09: qty 1

## 2016-04-09 MED ORDER — MEPERIDINE HCL 25 MG/ML IJ SOLN: 6.2500 mg | INTRAMUSCULAR | Status: DC | PRN

## 2016-04-09 MED ORDER — LIDOCAINE-EPINEPHRINE 2 %-1:100000 IJ SOLN
INTRAMUSCULAR | Status: AC
  Filled 2016-04-09: qty 3.4

## 2016-04-09 MED ORDER — LIDOCAINE HCL (CARDIAC) 20 MG/ML IV SOLN
INTRAVENOUS | Status: DC | PRN
  Administered 2016-04-09: 50 mg via INTRAVENOUS

## 2016-04-09 MED ORDER — DEXAMETHASONE SODIUM PHOSPHATE 10 MG/ML IJ SOLN
INTRAMUSCULAR | Status: DC | PRN
  Administered 2016-04-09: 10 mg via INTRAVENOUS

## 2016-04-09 MED ORDER — DEXAMETHASONE SODIUM PHOSPHATE 10 MG/ML IJ SOLN
INTRAMUSCULAR | Status: AC
  Filled 2016-04-09: qty 1

## 2016-04-09 MED ORDER — FENTANYL CITRATE (PF) 100 MCG/2ML IJ SOLN
50.0000 ug | INTRAMUSCULAR | Status: DC | PRN
Start: 2016-04-09 — End: 2016-04-09

## 2016-04-09 MED ORDER — OXYCODONE HCL 5 MG/5ML PO SOLN: 5.0000 mg | Freq: Once | ORAL | Status: DC | PRN

## 2016-04-09 MED ORDER — LIDOCAINE-EPINEPHRINE (PF) 2 %-1:200000 IJ SOLN
10.0000 mL | Freq: Once | INTRAMUSCULAR | Status: AC
  Administered 2016-04-09: 10 mL
  Filled 2016-04-09: qty 20

## 2016-04-09 MED ORDER — ONDANSETRON HCL 4 MG/2ML IJ SOLN
INTRAMUSCULAR | Status: DC | PRN
  Administered 2016-04-09: 4 mg via INTRAVENOUS

## 2016-04-09 MED ORDER — CLINDAMYCIN HCL 300 MG PO CAPS: 300.0000 mg | ORAL_CAPSULE | Freq: Three times a day (TID) | ORAL | 0 refills | Status: AC

## 2016-04-09 MED ORDER — CLINDAMYCIN PHOSPHATE 600 MG/50ML IV SOLN
INTRAVENOUS | Status: DC | PRN
  Administered 2016-04-09: 600 mg via INTRAVENOUS

## 2016-04-09 MED ORDER — LACTATED RINGERS IV SOLN
INTRAVENOUS | Status: DC
  Administered 2016-04-09: 10:00:00 via INTRAVENOUS

## 2016-04-09 MED ORDER — OXYCODONE HCL 5 MG PO TABS: 5.0000 mg | ORAL_TABLET | Freq: Once | ORAL | Status: DC | PRN

## 2016-04-09 MED ORDER — LIDOCAINE-EPINEPHRINE 2 %-1:100000 IJ SOLN
INTRAMUSCULAR | Status: DC | PRN
  Administered 2016-04-09: 1.7 mL

## 2016-04-09 MED ORDER — MIDAZOLAM HCL 2 MG/2ML IJ SOLN: 1.0000 mg | INTRAMUSCULAR | Status: DC | PRN

## 2016-04-09 MED ORDER — SCOPOLAMINE 1 MG/3DAYS TD PT72: 1.0000 | MEDICATED_PATCH | Freq: Once | TRANSDERMAL | Status: DC | PRN

## 2016-04-09 MED ORDER — PROPOFOL 500 MG/50ML IV EMUL
INTRAVENOUS | Status: DC | PRN
  Administered 2016-04-09: 25 ug/kg/min via INTRAVENOUS

## 2016-04-09 SURGICAL SUPPLY — 68 items
ADH SKN CLS APL DERMABOND .7 (GAUZE/BANDAGES/DRESSINGS) ×2
ATTRACTOMAT 16X20 MAGNETIC DRP (DRAPES) IMPLANT
BLADE SURG 15 STRL LF DISP TIS (BLADE) ×1 IMPLANT
BLADE SURG 15 STRL SS (BLADE) ×4
CANISTER SUCT 1200ML W/VALVE (MISCELLANEOUS) ×4 IMPLANT
CORDS BIPOLAR (ELECTRODE) IMPLANT
COVER BACK TABLE 60X90IN (DRAPES) ×4 IMPLANT
COVER MAYO STAND STRL (DRAPES) ×4 IMPLANT
DECANTER SPIKE VIAL GLASS SM (MISCELLANEOUS) IMPLANT
DERMABOND ADVANCED (GAUZE/BANDAGES/DRESSINGS) ×2
DERMABOND ADVANCED .7 DNX12 (GAUZE/BANDAGES/DRESSINGS) ×2 IMPLANT
DRAIN JACKSON RD 7FR 3/32 (WOUND CARE) IMPLANT
DRAIN PENROSE 1/4X12 LTX STRL (WOUND CARE) IMPLANT
DRAIN TLS ROUND 10FR (DRAIN) IMPLANT
DRAPE U-SHAPE 76X120 STRL (DRAPES) ×4 IMPLANT
ELECT COATED BLADE 2.86 ST (ELECTRODE) ×1 IMPLANT
ELECT NDL BLADE 2-5/6 (NEEDLE) IMPLANT
ELECT NEEDLE BLADE 2-5/6 (NEEDLE) IMPLANT
ELECT PAIRED SUBDERMAL (MISCELLANEOUS)
ELECT REM PT RETURN 9FT ADLT (ELECTROSURGICAL) ×4
ELECTRODE PAIRED SUBDERMAL (MISCELLANEOUS) IMPLANT
ELECTRODE REM PT RTRN 9FT ADLT (ELECTROSURGICAL) ×2 IMPLANT
EVACUATOR SILICONE 100CC (DRAIN) IMPLANT
FORCEPS BIPOLAR SPETZLER 8 1.0 (NEUROSURGERY SUPPLIES) IMPLANT
GAUZE SPONGE 4X4 16PLY XRAY LF (GAUZE/BANDAGES/DRESSINGS) IMPLANT
GLOVE BIO SURGEON STRL SZ 6.5 (GLOVE) ×2 IMPLANT
GLOVE BIO SURGEON STRL SZ7.5 (GLOVE) ×4 IMPLANT
GLOVE BIO SURGEONS STRL SZ 6.5 (GLOVE) ×1
GLOVE BIOGEL PI IND STRL 7.0 (GLOVE) ×1 IMPLANT
GLOVE BIOGEL PI INDICATOR 7.0 (GLOVE) ×2
GOWN STRL REUS W/ TWL LRG LVL3 (GOWN DISPOSABLE) ×4 IMPLANT
GOWN STRL REUS W/TWL LRG LVL3 (GOWN DISPOSABLE) ×8
HEMOSTAT SURGICEL 2X14 (HEMOSTASIS) IMPLANT
LOCATOR NERVE 3 VOLT (DISPOSABLE) IMPLANT
NDL DENTAL 27 LONG (NEEDLE) IMPLANT
NDL HYPO 25X1 1.5 SAFETY (NEEDLE) ×1 IMPLANT
NEEDLE DENTAL 27 LONG (NEEDLE) ×4 IMPLANT
NEEDLE HYPO 25X1 1.5 SAFETY (NEEDLE) IMPLANT
NS IRRIG 1000ML POUR BTL (IV SOLUTION) ×4 IMPLANT
PACK BASIN DAY SURGERY FS (CUSTOM PROCEDURE TRAY) ×4 IMPLANT
PENCIL BUTTON HOLSTER BLD 10FT (ELECTRODE) ×4 IMPLANT
PIN SAFETY STERILE (MISCELLANEOUS) IMPLANT
PROBE NERVBE PRASS .33 (MISCELLANEOUS) IMPLANT
SHEARS HARMONIC 9CM CVD (BLADE) IMPLANT
SLEEVE SCD COMPRESS KNEE MED (MISCELLANEOUS) IMPLANT
SPONGE GAUZE 2X2 8PLY STER LF (GAUZE/BANDAGES/DRESSINGS)
SPONGE GAUZE 2X2 8PLY STRL LF (GAUZE/BANDAGES/DRESSINGS) IMPLANT
SPONGE GAUZE 4X4 12PLY STER LF (GAUZE/BANDAGES/DRESSINGS) IMPLANT
SUCTION FRAZIER HANDLE 10FR (MISCELLANEOUS) ×2
SUCTION TUBE FRAZIER 10FR DISP (MISCELLANEOUS) ×1 IMPLANT
SUT ETHILON 3 0 PS 1 (SUTURE) IMPLANT
SUT ETHILON 5 0 P 3 18 (SUTURE)
SUT NYLON ETHILON 5-0 P-3 1X18 (SUTURE) IMPLANT
SUT PROLENE 4 0 P 3 18 (SUTURE) IMPLANT
SUT SILK 3 0 TIES 17X18 (SUTURE)
SUT SILK 3-0 18XBRD TIE BLK (SUTURE) IMPLANT
SUT SILK 4 0 TIES 17X18 (SUTURE) IMPLANT
SUT VIC AB 3-0 FS2 27 (SUTURE) IMPLANT
SUT VIC AB 4-0 P-3 18XBRD (SUTURE) IMPLANT
SUT VIC AB 4-0 P3 18 (SUTURE)
SUT VICRYL 4-0 PS2 18IN ABS (SUTURE) ×4 IMPLANT
SYR BULB 3OZ (MISCELLANEOUS) ×4 IMPLANT
SYR CONTROL 10ML LL (SYRINGE) ×4 IMPLANT
TOWEL OR 17X24 6PK STRL BLUE (TOWEL DISPOSABLE) ×4 IMPLANT
TRAY DSU PREP LF (CUSTOM PROCEDURE TRAY) ×3 IMPLANT
TUBE CONNECTING 20'X1/4 (TUBING) ×1
TUBE CONNECTING 20X1/4 (TUBING) ×3 IMPLANT
YANKAUER SUCT BULB TIP NO VENT (SUCTIONS) IMPLANT

## 2016-04-09 NOTE — Anesthesia Postprocedure Evaluation (Signed)
Anesthesia Post Note  Patient: Kelsey Warner  Procedure(s) Performed: Procedure(s) (LRB): REMOVAL FOREIGN BODY RIGHT FACE (Right)  Patient location during evaluation: PACU Anesthesia Type: MAC Level of consciousness: awake and alert Pain management: pain level controlled Vital Signs Assessment: post-procedure vital signs reviewed and stable Respiratory status: spontaneous breathing Cardiovascular status: stable Anesthetic complications: no       Last Vitals:  Vitals:   04/09/16 1130 04/09/16 1157  BP: (!) 124/52 137/60  Pulse: 73 79  Resp: 17 18  Temp:  37.4 C    Last Pain:  Vitals:   04/09/16 1157  TempSrc:   PainSc: 0-No pain                 Nolon Nations

## 2016-04-09 NOTE — ED Notes (Signed)
Dr. Teoh at bedside  

## 2016-04-09 NOTE — Consult Note (Signed)
Reason for Consult: Facial trauma, embedded facial foreign body Referring Physician: Delora Fuel, MD  HPI:  Kelsey Warner is an 76 y.o. female who presented to the ER this morning after a syncopal episode and fall. Pt states that she was walking to the bathroom when she felt dizzy, lost consciousness and fell. She does not remember anything from her fall. Pt reports associated back pain, nausea, cough and dizziness which has been present all day. She denies any CP, chest tightness or vomiting. Pt is not currently taking any blood thinners. Her facial CT shows a large foreign body embedded in her right face, causing fractures and penetration of her right anterior maxillary wall and right orbital floor.   Past Medical History:  Diagnosis Date  . Elevated LFTs   . Fatty liver disease, nonalcoholic   . Fibromyalgia   . Hyperlipidemia   . Migraine   . Neuromuscular disorder ([place])   . Osteopenia   . Thyroid disease    hypothyroid  . Varicose veins     Past Surgical History:  Procedure Laterality Date  . APPENDECTOMY    . BREAST SURGERY     biopsy  . ROTATOR CUFF REPAIR    . TONSILLECTOMY    . TUBAL LIGATION      Family History  Problem Relation Age of Onset  . Pulmonary fibrosis Father   . Heart disease Father   . Hyperlipidemia Father   . Heart disease Maternal Grandmother   . Diabetes Maternal Grandmother   . Hypertension Maternal Grandmother   . Stroke Maternal Grandmother   . Mental illness Mother   . Mental illness Sister   . Cancer Sister   . COPD Sister   . Pulmonary fibrosis Paternal Uncle     Social History:  reports that she quit smoking about 39 years ago. Her smoking use included Cigarettes. She has a 16.00 pack-year smoking history. She has never used smokeless tobacco. She reports that she does not drink alcohol or use drugs.  Allergies:  Allergies  Allergen Reactions  . Adhesive [Tape] Other (See Comments)    blisters  . Other     Rolilox  .  Penicillins     REACTION: hives, rash  . Statins Other (See Comments)    Tried multiple statins, all caused muscle aches. 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    Prior to Admission medications   Medication Sig Start Date End Date Taking? Authorizing Provider  aspirin 81 MG tablet Take 81 mg by mouth daily.     Yes Historical Provider, MD  carisoprodol (SOMA) 250 MG tablet Take 1 tablet (250 mg total) by mouth daily as needed (muscle spasm). 09/12/15  Yes Chelle Jeffery, PA-C  Cholecalciferol (VITAMIN D-3 PO) Take 1 tablet by mouth daily. TAKE 1 TAB DAILY    Yes Historical Provider, MD  KRILL OIL PO Take 1 tablet by mouth.   Yes Historical Provider, MD  levothyroxine (SYNTHROID, LEVOTHROID) 112 MCG tablet TAKE 1 TABLET BY MOUTH EVERY DAY BEFORE BREAKFAST 12/31/15  Yes Chelle Jeffery, PA-C  LYRICA 50 MG capsule TAKE 1 CAPSULE BY MOUTH THREE TIMES DAILY 04/01/16  Yes Chelle Jeffery, PA-C  MAGNESIUM PO Take 1 capsule by mouth.   Yes Historical Provider, MD  pyridOXINE (VITAMIN B-6) 100 MG tablet Take 100 mg by mouth daily.   Yes Historical Provider, MD  rizatriptan (MAXALT) 5 MG tablet Take 1 tablet (5 mg total) by mouth as needed for migraine. May repeat in  2 hours if needed 09/12/15  Yes Chelle Jeffery, PA-C  valACYclovir (VALTREX) 1000 MG tablet TAKE 2 TABLETS BY MOUTH TWICE DAILY FOR HERPES SIMPLEX ON LIP AS DIRECTED Patient taking differently: Take 2,000 mg by mouth 2 (two) times daily as needed (outbreaks). TAKE 2 TABLETS BY MOUTH TWICE DAILY FOR HERPES SIMPLEX ON LIP AS DIRECTED 09/12/15  Yes Chelle Jeffery, PA-C  vitamin B-12 (CYANOCOBALAMIN) 1000 MCG tablet Take 1,000 mcg by mouth daily.     Yes Historical Provider, MD    Results for orders placed or performed during the hospital encounter of 04/09/16 (from the past 48 hour(s))  CBC with Differential     Status: Abnormal   Collection Time: 04/09/16  1:17 AM  Result Value Ref Range   WBC 8.6 4.0 - 10.5 K/uL    RBC 4.01 3.87 - 5.11 MIL/uL   Hemoglobin 12.1 12.0 - 15.0 g/dL   HCT 36.0 36.0 - 46.0 %   MCV 89.8 78.0 - 100.0 fL   MCH 30.2 26.0 - 34.0 pg   MCHC 33.6 30.0 - 36.0 g/dL   RDW 13.5 11.5 - 15.5 %   Platelets 216 150 - 400 K/uL   Neutrophils Relative % 86 %   Neutro Abs 7.4 1.7 - 7.7 K/uL   Lymphocytes Relative 7 %   Lymphs Abs 0.6 (L) 0.7 - 4.0 K/uL   Monocytes Relative 6 %   Monocytes Absolute 0.5 0.1 - 1.0 K/uL   Eosinophils Relative 1 %   Eosinophils Absolute 0.1 0.0 - 0.7 K/uL   Basophils Relative 0 %   Basophils Absolute 0.0 0.0 - 0.1 K/uL  Basic metabolic panel     Status: Abnormal   Collection Time: 04/09/16  1:17 AM  Result Value Ref Range   Sodium 134 (L) 135 - 145 mmol/L   Potassium 3.9 3.5 - 5.1 mmol/L   Chloride 102 101 - 111 mmol/L   CO2 23 22 - 32 mmol/L   Glucose, Bld 139 (H) 65 - 99 mg/dL   BUN 11 6 - 20 mg/dL   Creatinine, Ser 0.79 0.44 - 1.00 mg/dL   Calcium 9.0 8.9 - 10.3 mg/dL   GFR calc non Af Amer >60 >60 mL/min   GFR calc Af Amer >60 >60 mL/min    Comment: (NOTE) The eGFR has been calculated using the CKD EPI equation. This calculation has not been validated in all clinical situations. eGFR's persistently <60 mL/min signify possible Chronic Kidney Disease.    Anion gap 9 5 - 15  I-Stat CG4 Lactic Acid, ED     Status: None   Collection Time: 04/09/16  1:37 AM  Result Value Ref Range   Lactic Acid, Venous 1.03 0.5 - 1.9 mmol/L    Ct Head Wo Contrast  Result Date: 04/09/2016 CLINICAL DATA:  Initial evaluation for acute trauma, fall. EXAM: CT HEAD WITHOUT CONTRAST CT CERVICAL SPINE WITHOUT CONTRAST TECHNIQUE: Multidetector CT imaging of the head and cervical spine was performed following the standard protocol without intravenous contrast. Multiplanar CT image reconstructions of the cervical spine were also generated. COMPARISON:  None available. FINDINGS: CT HEAD FINDINGS Brain: Age appropriate cerebral atrophy present. No acute intracranial  hemorrhage. No evidence for acute large vessel territory infarct. No mass lesion, midline shift or mass effect. No hydrocephalus. No extra-axial fluid collection. Vascular: No hyperdense vessel. Scattered vascular calcifications present within the carotid siphons. Skull: Soft tissue contusion with laceration present at the left scalp near the vertex. Calvarium intact. Sinuses/Orbits: There is an  acute right orbital floor fracture. There appears to be a a superimposed linear radiopaque foreign body extending through the right maxillary sinus and inferior aspect of the right orbit (series 6, image 22). Layering blood within the right maxillary sinus. Globes intact. No retro-orbital hematoma or other pathology. Paranasal sinuses are otherwise clear.  No mastoid effusion. CT CERVICAL SPINE FINDINGS Alignment: Vertebral bodies normally aligned with preservation of the normal cervical lordosis. No listhesis. Skull base and vertebrae: Skullbase intact. Normal C1-2 articulations preserved. Dens is intact. Vertebral body heights maintained. No acute fracture identified. Soft tissues and spinal canal: Visualized soft tissues of the neck demonstrate no acute abnormality. No prevertebral edema. Vascular calcifications present about the carotid bifurcations. Disc levels: Moderate degenerative spondylolysis noted at C5-6. Multilevel facet arthrosis noted, most notable at C4-5 on the left. Upper chest: Unremarkable. IMPRESSION: CT BRAIN: 1. No acute intracranial process identified. 2. Acute right orbital floor fracture with suspected superimposed radiopaque foreign body, incompletely evaluated on this exam. Further evaluation with dedicated maxillofacial CT recommended. 3. Acute scalp contusion/laceration at the left vertex. CT CERVICAL SPINE: No acute traumatic injury within the cervical spine. Electronically Signed   By: Jeannine Boga M.D.   On: 04/09/2016 03:27   Ct Chest W Contrast  Result Date: 04/09/2016 CLINICAL  DATA:  Initial evaluation for acute trauma, fall. EXAM: CT CHEST, ABDOMEN, AND PELVIS WITH CONTRAST TECHNIQUE: Multidetector CT imaging of the chest, abdomen and pelvis was performed following the standard protocol during bolus administration of intravenous contrast. CONTRAST:  156m ISOVUE-300 IOPAMIDOL (ISOVUE-300) INJECTION 61% COMPARISON:  Prior ultrasound from 03/07/2016. FINDINGS: CT CHEST FINDINGS Cardiovascular: Intrathoracic aorta of normal caliber without acute abnormality. Mild to moderate for age atheromatous plaque. Aortic valvular calcifications noted as well. Visualized great vessels within normal limits. Heart size within normal limits. No pericardial effusion. Scattered coronary artery calcifications noted. Limited evaluation the pulmonary arteries grossly unremarkable. Mediastinum/Nodes: No pathologically enlarged mediastinal, hilar, or axillary lymph nodes identified. Esophagus within normal limits. Lungs/Pleura: Dependent atelectasis present within the lower lobes bilaterally. Superimposed mild subpleural fibrotic lung changes. No focal infiltrates. No evidence for pulmonary contusion. No pulmonary edema or pleural effusion. No pneumothorax. No worrisome pulmonary nodule or mass. Musculoskeletal: No acute osseous abnormality identified. No worrisome lytic or blastic osseous lesions. CT ABDOMEN PELVIS FINDINGS Hepatobiliary: Liver within normal limits. Gallbladder normal. No biliary dilatation. Pancreas: Pancreas within normal limits. Spleen: Spleen within normal limits. Adrenals/Urinary Tract: Adrenal glands are normal. Kidneys equal in size with symmetric enhancement. No nephrolithiasis, hydronephrosis, or focal enhancing renal mass. No hydroureter. Bladder within normal limits. Stomach/Bowel: Small hiatal hernia noted. Stomach otherwise unremarkable. No evidence for bowel obstruction or acute bowel injury. No acute inflammatory changes seen about the bowels. Vascular/Lymphatic: Normal  intravascular enhancement seen throughout the intra-abdominal aorta and its branch vessels. Moderate atherosclerosis. No adenopathy. Reproductive: Uterus and ovaries within normal limits. Other: Small fat containing paraumbilical hernia. No free intraperitoneal air. No free fluid. No mesenteric or retroperitoneal hematoma. Musculoskeletal: No acute osseous abnormality. No fracture. No worrisome lytic or blastic osseous lesions. Multilevel degenerate spondylolysis noted within the visualized spine. External soft tissues within normal limits. IMPRESSION: 1. No CT evidence for acute traumatic injury within the chest, abdomen, and pelvis. 2. No other acute abnormality identified. Electronically Signed   By: BJeannine BogaM.D.   On: 04/09/2016 03:42   Ct Cervical Spine Wo Contrast  Result Date: 04/09/2016 CLINICAL DATA:  Initial evaluation for acute trauma, fall. EXAM: CT HEAD WITHOUT CONTRAST CT CERVICAL SPINE  WITHOUT CONTRAST TECHNIQUE: Multidetector CT imaging of the head and cervical spine was performed following the standard protocol without intravenous contrast. Multiplanar CT image reconstructions of the cervical spine were also generated. COMPARISON:  None available. FINDINGS: CT HEAD FINDINGS Brain: Age appropriate cerebral atrophy present. No acute intracranial hemorrhage. No evidence for acute large vessel territory infarct. No mass lesion, midline shift or mass effect. No hydrocephalus. No extra-axial fluid collection. Vascular: No hyperdense vessel. Scattered vascular calcifications present within the carotid siphons. Skull: Soft tissue contusion with laceration present at the left scalp near the vertex. Calvarium intact. Sinuses/Orbits: There is an acute right orbital floor fracture. There appears to be a a superimposed linear radiopaque foreign body extending through the right maxillary sinus and inferior aspect of the right orbit (series 6, image 22). Layering blood within the right maxillary  sinus. Globes intact. No retro-orbital hematoma or other pathology. Paranasal sinuses are otherwise clear.  No mastoid effusion. CT CERVICAL SPINE FINDINGS Alignment: Vertebral bodies normally aligned with preservation of the normal cervical lordosis. No listhesis. Skull base and vertebrae: Skullbase intact. Normal C1-2 articulations preserved. Dens is intact. Vertebral body heights maintained. No acute fracture identified. Soft tissues and spinal canal: Visualized soft tissues of the neck demonstrate no acute abnormality. No prevertebral edema. Vascular calcifications present about the carotid bifurcations. Disc levels: Moderate degenerative spondylolysis noted at C5-6. Multilevel facet arthrosis noted, most notable at C4-5 on the left. Upper chest: Unremarkable. IMPRESSION: CT BRAIN: 1. No acute intracranial process identified. 2. Acute right orbital floor fracture with suspected superimposed radiopaque foreign body, incompletely evaluated on this exam. Further evaluation with dedicated maxillofacial CT recommended. 3. Acute scalp contusion/laceration at the left vertex. CT CERVICAL SPINE: No acute traumatic injury within the cervical spine. Electronically Signed   By: Jeannine Boga M.D.   On: 04/09/2016 03:27   Ct Abdomen Pelvis W Contrast  Result Date: 04/09/2016 CLINICAL DATA:  Initial evaluation for acute trauma, fall. EXAM: CT CHEST, ABDOMEN, AND PELVIS WITH CONTRAST TECHNIQUE: Multidetector CT imaging of the chest, abdomen and pelvis was performed following the standard protocol during bolus administration of intravenous contrast. CONTRAST:  170m ISOVUE-300 IOPAMIDOL (ISOVUE-300) INJECTION 61% COMPARISON:  Prior ultrasound from 03/07/2016. FINDINGS: CT CHEST FINDINGS Cardiovascular: Intrathoracic aorta of normal caliber without acute abnormality. Mild to moderate for age atheromatous plaque. Aortic valvular calcifications noted as well. Visualized great vessels within normal limits. Heart size  within normal limits. No pericardial effusion. Scattered coronary artery calcifications noted. Limited evaluation the pulmonary arteries grossly unremarkable. Mediastinum/Nodes: No pathologically enlarged mediastinal, hilar, or axillary lymph nodes identified. Esophagus within normal limits. Lungs/Pleura: Dependent atelectasis present within the lower lobes bilaterally. Superimposed mild subpleural fibrotic lung changes. No focal infiltrates. No evidence for pulmonary contusion. No pulmonary edema or pleural effusion. No pneumothorax. No worrisome pulmonary nodule or mass. Musculoskeletal: No acute osseous abnormality identified. No worrisome lytic or blastic osseous lesions. CT ABDOMEN PELVIS FINDINGS Hepatobiliary: Liver within normal limits. Gallbladder normal. No biliary dilatation. Pancreas: Pancreas within normal limits. Spleen: Spleen within normal limits. Adrenals/Urinary Tract: Adrenal glands are normal. Kidneys equal in size with symmetric enhancement. No nephrolithiasis, hydronephrosis, or focal enhancing renal mass. No hydroureter. Bladder within normal limits. Stomach/Bowel: Small hiatal hernia noted. Stomach otherwise unremarkable. No evidence for bowel obstruction or acute bowel injury. No acute inflammatory changes seen about the bowels. Vascular/Lymphatic: Normal intravascular enhancement seen throughout the intra-abdominal aorta and its branch vessels. Moderate atherosclerosis. No adenopathy. Reproductive: Uterus and ovaries within normal limits. Other: Small fat containing  paraumbilical hernia. No free intraperitoneal air. No free fluid. No mesenteric or retroperitoneal hematoma. Musculoskeletal: No acute osseous abnormality. No fracture. No worrisome lytic or blastic osseous lesions. Multilevel degenerate spondylolysis noted within the visualized spine. External soft tissues within normal limits. IMPRESSION: 1. No CT evidence for acute traumatic injury within the chest, abdomen, and pelvis. 2. No  other acute abnormality identified. Electronically Signed   By: Jeannine Boga M.D.   On: 04/09/2016 03:42   Ct Maxillofacial Wo Contrast  Result Date: 04/09/2016 CLINICAL DATA:  76 year old female with fall and right orbital fracture. EXAM: CT MAXILLOFACIAL WITHOUT CONTRAST TECHNIQUE: Multidetector CT imaging of the maxillofacial structures was performed. Multiplanar CT image reconstructions were also generated. A small metallic BB was placed on the right temple in order to reliably differentiate right from left. COMPARISON:  CT of the head dated 04/09/2016 FINDINGS: Osseous: A linear, blade like, radiopaque foreign object noted extending from the right anterior facial soft tissues through the anterior wall of the right maxillary sinus and right orbital floor. The tip of the foreign object is in the inferior aspect of the right orbit inferior to the inferior rectus muscle. Do bb no other acute facial bone fractures identified. Orbits: There is small right orbital emphysema. No fluid collection or hematoma. The globe appears intact. The retro-orbital fat are preserved. Correlation with clinical exam is recommended to exclude ocular entrapment. Sinuses: There is partial opacification of the right maxillary sinus with high attenuating content and air-fluid levels compatible with hemosinus. The remainder of the paranasal sinuses and mastoid air cells are clear. Soft tissues: Laceration of the right anterior facial soft tissue at the site of the penetrating injury. Linear radiopaque densities along the tract of the penetrating object noted. No fluid collection or large hematoma. Limited intracranial: No significant or unexpected finding. IMPRESSION: Penetrating injury through the right anterior face with a linear and blade like structure extending through the fractures of the anterior wall of the right maxillary sinus and right orbital floor. The tip of the penetrating object is located in the inferior aspect  of the right orbit of inferior to the inferior rectus muscle. Correlation with clinical exam recommended to exclude ocular entrapment. No orbital hematoma or traumatic ocular injury. Right maxillary hemosinus. Electronically Signed   By: Anner Crete M.D.   On: 04/09/2016 05:47   Review of Systems  Respiratory: Positive for cough. Negative for chest tightness.   Cardiovascular: Positive for syncope. Negative for chest pain.  Gastrointestinal: Positive for nausea. Negative for vomiting.  Musculoskeletal: Positive for back pain.  Skin: Positive for wound.  Neurological: Positive for dizziness, syncope and light-headedness.  All other systems reviewed and are negative.  Blood pressure 122/80, pulse 87, temperature 100.2 F (37.9 C), temperature source Oral, resp. rate 18, SpO2 94 %. Physical Exam  Constitutional: She is oriented to person, place, and time. She appears well-developed and well-nourished.  Head: Laceration to left frontal scalp. Small laceration of the right frontal scalp. Abrasion lateral to the right eye. Laceration to upper lip where it meets the nose. Laceration just to the right of the nasal ala.  Eyes: EOM are normal. Pupils are equal, round, and reactive to light. Vision grossly intact. Ears: Normal auricles and EACs. Mouth: Normal mucosa. Cardiovascular: Normal rate, regular rhythm and normal heart sounds.   Pulmonary/Chest: Effort normal and breath sounds normal. She has no wheezes. She has no rales. She exhibits no tenderness.  Abdominal: Soft. Bowel sounds are normal. She exhibits no distension  and no mass. There is no tenderness.  Musculoskeletal: Normal range of motion. She exhibits no edema.  Lymphadenopathy:  She has no cervical adenopathy.  Neurological: She is alert and oriented to person, place, and time. No cranial nerve deficit. She exhibits normal muscle tone. Coordination normal.  Skin: Skin is warm and dry. No rash noted.  Psychiatric: She has a normal  mood and affect. Her behavior is normal. Judgment and thought content normal.  Nursing note and vitals reviewed.  Assessment/Plan: Large embedded right facial foreign body, causing fractures and penetration of her right anterior maxillary wall and right orbital floor. Plan removal in the OR under sedation. The R/B/A/D of the procedure are reviewed with the patient. Inform consent obtained.   Camile Esters,SUI W 04/09/2016, 10:09 AM

## 2016-04-09 NOTE — ED Provider Notes (Signed)
[place] DEPT Provider Note   CSN: [id number] Arrival date & time: 04/09/16  [id number]   By signing my name below, I, Dolores Hoose, attest that this documentation has been prepared under the direction and in the presence of Delora Fuel, MD . Electronically Signed: Dolores Hoose, Scribe. 04/09/2016. 12:54 AM.   History   Chief Complaint Chief Complaint  Patient presents with  . Loss of Consciousness  . Head Laceration   The history is provided by the patient. No language interpreter was used.    HPI Comments:  Kelsey Warner is a 76 y.o. female who presents to the Emergency Department with parietal head pain due to a head laceration sustained after a syncopal episode and fall earlier tonight. Pt states that she was walking to the bathroom when she felt dizzy, lost consciousness and fell. She does not remember anything from her fall. Pt reports associated back pain, nausea, cough and dizziness which has been present all day. She denies any CP, chest tightness or vomiting. Pt is not currently taking any blood thinners.   Past Medical History:  Diagnosis Date  . Elevated LFTs   . Fatty liver disease, nonalcoholic   . Fibromyalgia   . Hyperlipidemia   . Migraine   . Neuromuscular disorder ([place])   . Osteopenia   . Thyroid disease    hypothyroid  . Varicose veins     Patient Active Problem List   Diagnosis Date Noted  . Migraine 09/12/2015  . Fever blister 09/12/2015  . BMI 28.0-28.9,adult 09/12/2015  . Peripheral neuropathy ([place]) 09/12/2015  . Osteopenia 09/12/2015  . Varicose veins 09/12/2015  . 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 Hyperlipidemia 09/12/2015  . Hypothyroid 12/19/2011  . Dyspnea 01/02/2011    Past Surgical History:  Procedure Laterality Date  . APPENDECTOMY    . BREAST SURGERY     biopsy  . ROTATOR CUFF REPAIR    . TONSILLECTOMY    . TUBAL LIGATION      OB History    No  data available       Home Medications    Prior to Admission medications   Medication Sig Start Date End Date Taking? Authorizing Provider  aspirin 81 MG tablet Take 81 mg by mouth daily.      Historical Provider, MD  carisoprodol (SOMA) 250 MG tablet Take 1 tablet (250 mg total) by mouth daily as needed (muscle spasm). 09/12/15   Chelle Jeffery, PA-C  Cholecalciferol (VITAMIN D-3 PO) Take by mouth. TAKE 1 TAB DAILY     Historical Provider, MD  KRILL OIL PO Take 1 tablet by mouth.    Historical Provider, MD  levothyroxine (SYNTHROID, LEVOTHROID) 112 MCG tablet TAKE 1 TABLET BY MOUTH EVERY DAY BEFORE BREAKFAST 12/31/15   Chelle Jeffery, PA-C  LYRICA 50 MG capsule TAKE 1 CAPSULE BY MOUTH THREE TIMES DAILY 04/01/16   Chelle Jeffery, PA-C  MAGNESIUM PO Take 1 capsule by mouth.    Historical Provider, MD  OVER THE COUNTER MEDICATION LEG CRAMP PILLS 1 TAB DAILY     Historical Provider, MD  pyridOXINE (VITAMIN B-6) 100 MG tablet Take 100 mg by mouth daily.    Historical Provider, MD  rizatriptan (MAXALT) 5 MG tablet Take 1 tablet (5 mg total) by mouth as needed for migraine. May repeat in 2 hours if needed 09/12/15   Harrison Mons, PA-C  valACYclovir (VALTREX) 1000 MG tablet TAKE 2 TABLETS BY MOUTH TWICE  DAILY FOR HERPES SIMPLEX ON LIP AS DIRECTED 09/12/15   Chelle Jeffery, PA-C  vitamin B-12 (CYANOCOBALAMIN) 1000 MCG tablet Take 1,000 mcg by mouth daily.      Historical Provider, MD    Family History Family History  Problem Relation Age of Onset  . Pulmonary fibrosis Father   . Heart disease Father   . Hyperlipidemia Father   . Heart disease Maternal Grandmother   . Diabetes Maternal Grandmother   . Hypertension Maternal Grandmother   . Stroke Maternal Grandmother   . Mental illness Mother   . Mental illness Sister   . Cancer Sister   . COPD Sister   . Pulmonary fibrosis Paternal Uncle     Social History Social History  Substance Use Topics  . Smoking status: Former Smoker     Packs/day: 0.80    Years: 20.00    Types: Cigarettes    Quit date: 03/18/1977  . Smokeless tobacco: Never Used  . Alcohol use No     Allergies   Adhesive [tape]; Other; Penicillins; and Statins   Review of Systems Review of Systems  Respiratory: Positive for cough. Negative for chest tightness.   Cardiovascular: Positive for syncope. Negative for chest pain.  Gastrointestinal: Positive for nausea. Negative for vomiting.  Musculoskeletal: Positive for back pain.  Skin: Positive for wound.  Neurological: Positive for dizziness, syncope and light-headedness.  All other systems reviewed and are negative.    Physical Exam Updated Vital Signs There were no vitals taken for this visit.  Physical Exam  Constitutional: She is oriented to person, place, and time. She appears well-developed and well-nourished.  HENT:  Head: Normocephalic and atraumatic.  Laceration to left frontal scalp. Small laceration of the right frontal scalp. Abrasion lateral to the right eye. Laceration to upper lip where it meets the nose. Laceration just to the right of the nasal ala.  Eyes: EOM are normal. Pupils are equal, round, and reactive to light.  Neck: No JVD present.  Neck immobilized in a cervical collar. Mild tenderness to posterior cervical spine.   Cardiovascular: Normal rate, regular rhythm and normal heart sounds.   No murmur heard. Pulmonary/Chest: Effort normal and breath sounds normal. She has no wheezes. She has no rales. She exhibits no tenderness.  Mild tenderness to right lateral spine. No crepitus.   Abdominal: Soft. Bowel sounds are normal. She exhibits no distension and no mass. There is no tenderness.  Musculoskeletal: Normal range of motion. She exhibits no edema.  Lymphadenopathy:    She has no cervical adenopathy.  Neurological: She is alert and oriented to person, place, and time. No cranial nerve deficit. She exhibits normal muscle tone. Coordination normal.  Skin: Skin is  warm and dry. No rash noted.  Psychiatric: She has a normal mood and affect. Her behavior is normal. Judgment and thought content normal.  Nursing note and vitals reviewed.    ED Treatments / Results  COORDINATION OF CARE:  1:03 AM Discussed treatment plan with pt at bedside which includes imaging and pt agreed to plan.  Labs (all labs ordered are listed, but only abnormal results are displayed) Labs Reviewed  CBC WITH DIFFERENTIAL/PLATELET - Abnormal; Notable for the following:       Result Value   Lymphs Abs 0.6 (*)    All other components within normal limits  BASIC METABOLIC PANEL - Abnormal; Notable for the following:    Sodium 134 (*)    Glucose, Bld 139 (*)    All other components  within normal limits  I-STAT CG4 LACTIC ACID, ED    EKG  EKG Interpretation  Date/Time:  Tuesday April 09 2016 00:39:16 EST Ventricular Rate:  101 PR Interval:    QRS Duration: 81 QT Interval:  324 QTC Calculation: 420 R Axis:   51 Text Interpretation:  Sinus tachycardia Consider left atrial enlargement Nonspecific T abnormalities, lateral leads When compared with ECG of 01/04/2011, No significant change was found Confirmed by [facility]  MD, Shadoe Cryan ([id number]) on 04/09/2016 1:14:59 AM       Radiology Ct Head Wo Contrast  Result Date: 04/09/2016 CLINICAL DATA:  Initial evaluation for acute trauma, fall. EXAM: CT HEAD WITHOUT CONTRAST CT CERVICAL SPINE WITHOUT CONTRAST TECHNIQUE: Multidetector CT imaging of the head and cervical spine was performed following the standard protocol without intravenous contrast. Multiplanar CT image reconstructions of the cervical spine were also generated. COMPARISON:  None available. FINDINGS: CT HEAD FINDINGS Brain: Age appropriate cerebral atrophy present. No acute intracranial hemorrhage. No evidence for acute large vessel territory infarct. No mass lesion, midline shift or mass effect. No hydrocephalus. No extra-axial fluid collection. Vascular: No hyperdense  vessel. Scattered vascular calcifications present within the carotid siphons. Skull: Soft tissue contusion with laceration present at the left scalp near the vertex. Calvarium intact. Sinuses/Orbits: There is an acute right orbital floor fracture. There appears to be a a superimposed linear radiopaque foreign body extending through the right maxillary sinus and inferior aspect of the right orbit (series 6, image 22). Layering blood within the right maxillary sinus. Globes intact. No retro-orbital hematoma or other pathology. Paranasal sinuses are otherwise clear.  No mastoid effusion. CT CERVICAL SPINE FINDINGS Alignment: Vertebral bodies normally aligned with preservation of the normal cervical lordosis. No listhesis. Skull base and vertebrae: Skullbase intact. Normal C1-2 articulations preserved. Dens is intact. Vertebral body heights maintained. No acute fracture identified. Soft tissues and spinal canal: Visualized soft tissues of the neck demonstrate no acute abnormality. No prevertebral edema. Vascular calcifications present about the carotid bifurcations. Disc levels: Moderate degenerative spondylolysis noted at C5-6. Multilevel facet arthrosis noted, most notable at C4-5 on the left. Upper chest: Unremarkable. IMPRESSION: CT BRAIN: 1. No acute intracranial process identified. 2. Acute right orbital floor fracture with suspected superimposed radiopaque foreign body, incompletely evaluated on this exam. Further evaluation with dedicated maxillofacial CT recommended. 3. Acute scalp contusion/laceration at the left vertex. CT CERVICAL SPINE: No acute traumatic injury within the cervical spine. Electronically Signed   By: Jeannine Boga M.D.   On: 04/09/2016 03:27   Ct Chest W Contrast  Result Date: 04/09/2016 CLINICAL DATA:  Initial evaluation for acute trauma, fall. EXAM: CT CHEST, ABDOMEN, AND PELVIS WITH CONTRAST TECHNIQUE: Multidetector CT imaging of the chest, abdomen and pelvis was performed  following the standard protocol during bolus administration of intravenous contrast. CONTRAST:  124mL ISOVUE-300 IOPAMIDOL (ISOVUE-300) INJECTION 61% COMPARISON:  Prior ultrasound from 03/07/2016. FINDINGS: CT CHEST FINDINGS Cardiovascular: Intrathoracic aorta of normal caliber without acute abnormality. Mild to moderate for age atheromatous plaque. Aortic valvular calcifications noted as well. Visualized great vessels within normal limits. Heart size within normal limits. No pericardial effusion. Scattered coronary artery calcifications noted. Limited evaluation the pulmonary arteries grossly unremarkable. Mediastinum/Nodes: No pathologically enlarged mediastinal, hilar, or axillary lymph nodes identified. Esophagus within normal limits. Lungs/Pleura: Dependent atelectasis present within the lower lobes bilaterally. Superimposed mild subpleural fibrotic lung changes. No focal infiltrates. No evidence for pulmonary contusion. No pulmonary edema or pleural effusion. No pneumothorax. No worrisome pulmonary nodule or mass. Musculoskeletal:  No acute osseous abnormality identified. No worrisome lytic or blastic osseous lesions. CT ABDOMEN PELVIS FINDINGS Hepatobiliary: Liver within normal limits. Gallbladder normal. No biliary dilatation. Pancreas: Pancreas within normal limits. Spleen: Spleen within normal limits. Adrenals/Urinary Tract: Adrenal glands are normal. Kidneys equal in size with symmetric enhancement. No nephrolithiasis, hydronephrosis, or focal enhancing renal mass. No hydroureter. Bladder within normal limits. Stomach/Bowel: Small hiatal hernia noted. Stomach otherwise unremarkable. No evidence for bowel obstruction or acute bowel injury. No acute inflammatory changes seen about the bowels. Vascular/Lymphatic: Normal intravascular enhancement seen throughout the intra-abdominal aorta and its branch vessels. Moderate atherosclerosis. No adenopathy. Reproductive: Uterus and ovaries within normal limits.  Other: Small fat containing paraumbilical hernia. No free intraperitoneal air. No free fluid. No mesenteric or retroperitoneal hematoma. Musculoskeletal: No acute osseous abnormality. No fracture. No worrisome lytic or blastic osseous lesions. Multilevel degenerate spondylolysis noted within the visualized spine. External soft tissues within normal limits. IMPRESSION: 1. No CT evidence for acute traumatic injury within the chest, abdomen, and pelvis. 2. No other acute abnormality identified. Electronically Signed   By: Jeannine Boga M.D.   On: 04/09/2016 03:42   Ct Cervical Spine Wo Contrast  Result Date: 04/09/2016 CLINICAL DATA:  Initial evaluation for acute trauma, fall. EXAM: CT HEAD WITHOUT CONTRAST CT CERVICAL SPINE WITHOUT CONTRAST TECHNIQUE: Multidetector CT imaging of the head and cervical spine was performed following the standard protocol without intravenous contrast. Multiplanar CT image reconstructions of the cervical spine were also generated. COMPARISON:  None available. FINDINGS: CT HEAD FINDINGS Brain: Age appropriate cerebral atrophy present. No acute intracranial hemorrhage. No evidence for acute large vessel territory infarct. No mass lesion, midline shift or mass effect. No hydrocephalus. No extra-axial fluid collection. Vascular: No hyperdense vessel. Scattered vascular calcifications present within the carotid siphons. Skull: Soft tissue contusion with laceration present at the left scalp near the vertex. Calvarium intact. Sinuses/Orbits: There is an acute right orbital floor fracture. There appears to be a a superimposed linear radiopaque foreign body extending through the right maxillary sinus and inferior aspect of the right orbit (series 6, image 22). Layering blood within the right maxillary sinus. Globes intact. No retro-orbital hematoma or other pathology. Paranasal sinuses are otherwise clear.  No mastoid effusion. CT CERVICAL SPINE FINDINGS Alignment: Vertebral bodies  normally aligned with preservation of the normal cervical lordosis. No listhesis. Skull base and vertebrae: Skullbase intact. Normal C1-2 articulations preserved. Dens is intact. Vertebral body heights maintained. No acute fracture identified. Soft tissues and spinal canal: Visualized soft tissues of the neck demonstrate no acute abnormality. No prevertebral edema. Vascular calcifications present about the carotid bifurcations. Disc levels: Moderate degenerative spondylolysis noted at C5-6. Multilevel facet arthrosis noted, most notable at C4-5 on the left. Upper chest: Unremarkable. IMPRESSION: CT BRAIN: 1. No acute intracranial process identified. 2. Acute right orbital floor fracture with suspected superimposed radiopaque foreign body, incompletely evaluated on this exam. Further evaluation with dedicated maxillofacial CT recommended. 3. Acute scalp contusion/laceration at the left vertex. CT CERVICAL SPINE: No acute traumatic injury within the cervical spine. Electronically Signed   By: Jeannine Boga M.D.   On: 04/09/2016 03:27   Ct Abdomen Pelvis W Contrast  Result Date: 04/09/2016 CLINICAL DATA:  Initial evaluation for acute trauma, fall. EXAM: CT CHEST, ABDOMEN, AND PELVIS WITH CONTRAST TECHNIQUE: Multidetector CT imaging of the chest, abdomen and pelvis was performed following the standard protocol during bolus administration of intravenous contrast. CONTRAST:  141mL ISOVUE-300 IOPAMIDOL (ISOVUE-300) INJECTION 61% COMPARISON:  Prior ultrasound from 03/07/2016.  FINDINGS: CT CHEST FINDINGS Cardiovascular: Intrathoracic aorta of normal caliber without acute abnormality. Mild to moderate for age atheromatous plaque. Aortic valvular calcifications noted as well. Visualized great vessels within normal limits. Heart size within normal limits. No pericardial effusion. Scattered coronary artery calcifications noted. Limited evaluation the pulmonary arteries grossly unremarkable. Mediastinum/Nodes: No  pathologically enlarged mediastinal, hilar, or axillary lymph nodes identified. Esophagus within normal limits. Lungs/Pleura: Dependent atelectasis present within the lower lobes bilaterally. Superimposed mild subpleural fibrotic lung changes. No focal infiltrates. No evidence for pulmonary contusion. No pulmonary edema or pleural effusion. No pneumothorax. No worrisome pulmonary nodule or mass. Musculoskeletal: No acute osseous abnormality identified. No worrisome lytic or blastic osseous lesions. CT ABDOMEN PELVIS FINDINGS Hepatobiliary: Liver within normal limits. Gallbladder normal. No biliary dilatation. Pancreas: Pancreas within normal limits. Spleen: Spleen within normal limits. Adrenals/Urinary Tract: Adrenal glands are normal. Kidneys equal in size with symmetric enhancement. No nephrolithiasis, hydronephrosis, or focal enhancing renal mass. No hydroureter. Bladder within normal limits. Stomach/Bowel: Small hiatal hernia noted. Stomach otherwise unremarkable. No evidence for bowel obstruction or acute bowel injury. No acute inflammatory changes seen about the bowels. Vascular/Lymphatic: Normal intravascular enhancement seen throughout the intra-abdominal aorta and its branch vessels. Moderate atherosclerosis. No adenopathy. Reproductive: Uterus and ovaries within normal limits. Other: Small fat containing paraumbilical hernia. No free intraperitoneal air. No free fluid. No mesenteric or retroperitoneal hematoma. Musculoskeletal: No acute osseous abnormality. No fracture. No worrisome lytic or blastic osseous lesions. Multilevel degenerate spondylolysis noted within the visualized spine. External soft tissues within normal limits. IMPRESSION: 1. No CT evidence for acute traumatic injury within the chest, abdomen, and pelvis. 2. No other acute abnormality identified. Electronically Signed   By: Jeannine Boga M.D.   On: 04/09/2016 03:42   Ct Maxillofacial Wo Contrast  Result Date:  04/09/2016 CLINICAL DATA:  76 year old female with fall and right orbital fracture. EXAM: CT MAXILLOFACIAL WITHOUT CONTRAST TECHNIQUE: Multidetector CT imaging of the maxillofacial structures was performed. Multiplanar CT image reconstructions were also generated. A small metallic BB was placed on the right temple in order to reliably differentiate right from left. COMPARISON:  CT of the head dated 04/09/2016 FINDINGS: Osseous: A linear, blade like, radiopaque foreign object noted extending from the right anterior facial soft tissues through the anterior wall of the right maxillary sinus and right orbital floor. The tip of the foreign object is in the inferior aspect of the right orbit inferior to the inferior rectus muscle. Do bb no other acute facial bone fractures identified. Orbits: There is small right orbital emphysema. No fluid collection or hematoma. The globe appears intact. The retro-orbital fat are preserved. Correlation with clinical exam is recommended to exclude ocular entrapment. Sinuses: There is partial opacification of the right maxillary sinus with high attenuating content and air-fluid levels compatible with hemosinus. The remainder of the paranasal sinuses and mastoid air cells are clear. Soft tissues: Laceration of the right anterior facial soft tissue at the site of the penetrating injury. Linear radiopaque densities along the tract of the penetrating object noted. No fluid collection or large hematoma. Limited intracranial: No significant or unexpected finding. IMPRESSION: Penetrating injury through the right anterior face with a linear and blade like structure extending through the fractures of the anterior wall of the right maxillary sinus and right orbital floor. The tip of the penetrating object is located in the inferior aspect of the right orbit of inferior to the inferior rectus muscle. Correlation with clinical exam recommended to exclude ocular entrapment. No orbital  hematoma or  traumatic ocular injury. Right maxillary hemosinus. Electronically Signed   By: Anner Crete M.D.   On: 04/09/2016 05:47    Procedures Procedures (including critical care time) LACERATION REPAIR Performed by: [id number] Authorized by: [id number] Consent: Verbal consent obtained. Risks and benefits: risks, benefits and alternatives were discussed Consent given by: patient Patient identity confirmed: provided demographic data Prepped and Draped in normal sterile fashion Wound explored  Laceration Location: Scalp  Laceration Length: 3.5cm  No Foreign Bodies seen or palpated  Anesthesia: local infiltration  Local anesthetic: Nione  Amount of cleaning: standard  Skin closure: Close   Number of staples: 7  Technique: Surgical stapling   Patient tolerance: Patient tolerated the procedure well with no immediate complications.  LACERATION REPAIR Performed by: [id number] Authorized by: [id number] Consent: Verbal consent obtained. Risks and benefits: risks, benefits and alternatives were discussed Consent given by: patient Patient identity confirmed: provided demographic data Prepped and Draped in normal sterile fashion Wound explored  Laceration Location: Scalp  Laceration Length: 1cm  No Foreign Bodies seen or palpated  Anesthesia: None  Amount of cleaning: standard  Skin closure: Close   Number of staples: 2  Technique: Surgical stapling   Patient tolerance: Patient tolerated the procedure well with no immediate complications.  LACERATION REPAIR Performed by: [id number] Authorized by: [id number] Consent: Verbal consent obtained. Risks and benefits: risks, benefits and alternatives were discussed Consent given by: patient Patient identity confirmed: provided demographic data Prepped and Draped in normal sterile fashion Wound explored  Laceration Location: Face  Laceration Length: 0.5cm  No Foreign Bodies seen or palpated  Anesthesia:  None   Amount of cleaning: standard  Skin closure: Close   Technique: Tissue adhesive   Patient tolerance: Patient tolerated the procedure well with no immediate complications.  LACERATION REPAIR Performed by: [id number] Authorized by: [id number] Consent: Verbal consent obtained. Risks and benefits: risks, benefits and alternatives were discussed Consent given by: patient Patient identity confirmed: provided demographic data Prepped and Draped in normal sterile fashion Wound explored  Laceration Location: Face  Laceration Length: 1.5cm  No Foreign Bodies seen or palpated  Anesthesia: None   Amount of cleaning: standard  Skin closure: Close   Technique: Tissue adhesive   Patient tolerance: Patient tolerated the procedure well with no immediate complications.   Medications Ordered in ED Medications  ondansetron (ZOFRAN) injection 4 mg (not administered)  morphine 4 MG/ML injection 4 mg (not administered)     Initial Impression / Assessment and Plan / ED Course  I have reviewed the triage vital signs and the nursing notes.  Pertinent labs & imaging results that were available during my care of the patient were reviewed by me and considered in my medical decision making (see chart for details).  Fall with scalp and facial lacerations. Several pieces of glass were pulled from her hair. She is sent for CT scans of head and cervical spine which appeared to show a fracture of the orbital floor and a farm body. Dedicated maxillofacial CT shows a foreign body which seems to penetrate through the anterior wall the right maxillary sinus and the right orbital floor. There is no obvious laceration over where the foreign body is seen. ENT is consulted for foreign body removal. I have discussed case with Dr. Benjamine Mola who states he will come in to see the patient. Scalp lacerations are closed with stapling, and facial lacerations are closed with Dermabond.  Final Clinical  Impressions(s) / ED Diagnoses   Final diagnoses:  Fall  at home, initial encounter  Facial laceration, initial encounter  Laceration of scalp, initial encounter  Open fracture of right orbit, initial encounter ([place])  Foreign body in nasal sinus, initial encounter    New Prescriptions New Prescriptions   No medications on file   I personally performed the services described in this documentation, which was scribed in my presence. The recorded information has been reviewed and is accurate.       Delora Fuel, MD [id number]

## 2016-04-09 NOTE — H&P (Signed)
HPI:  Kelsey Warner is an 76 y.o. female who presented to the ER this morning after a syncopal episode and fall. Pt states that she was walking to the bathroom when she felt dizzy, lost consciousness and fell. She does not remember anything from her fall. Pt reports associated back pain, nausea, cough and dizziness which has been present all day. She denies any CP, chest tightness or vomiting. Pt is not currently taking any blood thinners. Her facial CT shows a large foreign body embedded in her right face, causing fractures and penetration of her right anterior maxillary wall and right orbital floor.       Past Medical History:  Diagnosis Date  . Elevated LFTs   . Fatty liver disease, nonalcoholic   . Fibromyalgia   . Hyperlipidemia   . Migraine   . Neuromuscular disorder ([place])   . Osteopenia   . Thyroid disease    hypothyroid  . Varicose veins          Past Surgical History:  Procedure Laterality Date  . APPENDECTOMY    . BREAST SURGERY     biopsy  . ROTATOR CUFF REPAIR    . TONSILLECTOMY    . TUBAL LIGATION           Family History  Problem Relation Age of Onset  . Pulmonary fibrosis Father   . Heart disease Father   . Hyperlipidemia Father   . Heart disease Maternal Grandmother   . Diabetes Maternal Grandmother   . Hypertension Maternal Grandmother   . Stroke Maternal Grandmother   . Mental illness Mother   . Mental illness Sister   . Cancer Sister   . COPD Sister   . Pulmonary fibrosis Paternal Uncle     Social History:  reports that she quit smoking about 39 years ago. Her smoking use included Cigarettes. She has a 16.00 pack-year smoking history. She has never used smokeless tobacco. She reports that she does not drink alcohol or use drugs.  Allergies:       Allergies  Allergen Reactions  . Adhesive [Tape] Other (See Comments)    blisters  . Other     Rolilox  . Penicillins     REACTION: hives, rash  .  Statins Other (See Comments)    Tried multiple statins, all caused muscle aches. 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           Prior to Admission medications   Medication Sig Start Date End Date Taking? Authorizing Provider  aspirin 81 MG tablet Take 81 mg by mouth daily.     Yes Historical Provider, MD  carisoprodol (SOMA) 250 MG tablet Take 1 tablet (250 mg total) by mouth daily as needed (muscle spasm). 09/12/15  Yes Chelle Jeffery, PA-C  Cholecalciferol (VITAMIN D-3 PO) Take 1 tablet by mouth daily. TAKE 1 TAB DAILY    Yes Historical Provider, MD  KRILL OIL PO Take 1 tablet by mouth.   Yes Historical Provider, MD  levothyroxine (SYNTHROID, LEVOTHROID) 112 MCG tablet TAKE 1 TABLET BY MOUTH EVERY DAY BEFORE BREAKFAST 12/31/15  Yes Chelle Jeffery, PA-C  LYRICA 50 MG capsule TAKE 1 CAPSULE BY MOUTH THREE TIMES DAILY 04/01/16  Yes Chelle Jeffery, PA-C  MAGNESIUM PO Take 1 capsule by mouth.   Yes Historical Provider, MD  pyridOXINE (VITAMIN B-6) 100 MG tablet Take 100 mg by mouth daily.   Yes Historical Provider, MD  rizatriptan (MAXALT) 5 MG tablet Take 1 tablet (5  mg total) by mouth as needed for migraine. May repeat in 2 hours if needed 09/12/15  Yes Chelle Jeffery, PA-C  valACYclovir (VALTREX) 1000 MG tablet TAKE 2 TABLETS BY MOUTH TWICE DAILY FOR HERPES SIMPLEX ON LIP AS DIRECTED Patient taking differently: Take 2,000 mg by mouth 2 (two) times daily as needed (outbreaks). TAKE 2 TABLETS BY MOUTH TWICE DAILY FOR HERPES SIMPLEX ON LIP AS DIRECTED 09/12/15  Yes Chelle Jeffery, PA-C  vitamin B-12 (CYANOCOBALAMIN) 1000 MCG tablet Take 1,000 mcg by mouth daily.     Yes Historical Provider, MD    Lab Results Last 48 Hours        Results for orders placed or performed during the hospital encounter of 04/09/16 (from the past 48 hour(s))  CBC with Differential     Status: Abnormal   Collection Time: 04/09/16  1:17 AM  Result Value Ref Range    WBC 8.6 4.0 - 10.5 K/uL   RBC 4.01 3.87 - 5.11 MIL/uL   Hemoglobin 12.1 12.0 - 15.0 g/dL   HCT 36.0 36.0 - 46.0 %   MCV 89.8 78.0 - 100.0 fL   MCH 30.2 26.0 - 34.0 pg   MCHC 33.6 30.0 - 36.0 g/dL   RDW 13.5 11.5 - 15.5 %   Platelets 216 150 - 400 K/uL   Neutrophils Relative % 86 %   Neutro Abs 7.4 1.7 - 7.7 K/uL   Lymphocytes Relative 7 %   Lymphs Abs 0.6 (L) 0.7 - 4.0 K/uL   Monocytes Relative 6 %   Monocytes Absolute 0.5 0.1 - 1.0 K/uL   Eosinophils Relative 1 %   Eosinophils Absolute 0.1 0.0 - 0.7 K/uL   Basophils Relative 0 %   Basophils Absolute 0.0 0.0 - 0.1 K/uL  Basic metabolic panel     Status: Abnormal   Collection Time: 04/09/16  1:17 AM  Result Value Ref Range   Sodium 134 (L) 135 - 145 mmol/L   Potassium 3.9 3.5 - 5.1 mmol/L   Chloride 102 101 - 111 mmol/L   CO2 23 22 - 32 mmol/L   Glucose, Bld 139 (H) 65 - 99 mg/dL   BUN 11 6 - 20 mg/dL   Creatinine, Ser 0.79 0.44 - 1.00 mg/dL   Calcium 9.0 8.9 - 10.3 mg/dL   GFR calc non Af Amer >60 >60 mL/min   GFR calc Af Amer >60 >60 mL/min    Comment: (NOTE) The eGFR has been calculated using the CKD EPI equation. This calculation has not been validated in all clinical situations. eGFR's persistently <60 mL/min signify possible Chronic Kidney Disease.    Anion gap 9 5 - 15  I-Stat CG4 Lactic Acid, ED     Status: None   Collection Time: 04/09/16  1:37 AM  Result Value Ref Range   Lactic Acid, Venous 1.03 0.5 - 1.9 mmol/L       Imaging Results (Last 48 hours)  Ct Head Wo Contrast  Result Date: 04/09/2016 CLINICAL DATA:  Initial evaluation for acute trauma, fall. EXAM: CT HEAD WITHOUT CONTRAST CT CERVICAL SPINE WITHOUT CONTRAST TECHNIQUE: Multidetector CT imaging of the head and cervical spine was performed following the standard protocol without intravenous contrast. Multiplanar CT image reconstructions of the cervical spine were also generated. COMPARISON:  None available.  FINDINGS: CT HEAD FINDINGS Brain: Age appropriate cerebral atrophy present. No acute intracranial hemorrhage. No evidence for acute large vessel territory infarct. No mass lesion, midline shift or mass effect. No hydrocephalus. No extra-axial fluid collection.  Vascular: No hyperdense vessel. Scattered vascular calcifications present within the carotid siphons. Skull: Soft tissue contusion with laceration present at the left scalp near the vertex. Calvarium intact. Sinuses/Orbits: There is an acute right orbital floor fracture. There appears to be a a superimposed linear radiopaque foreign body extending through the right maxillary sinus and inferior aspect of the right orbit (series 6, image 22). Layering blood within the right maxillary sinus. Globes intact. No retro-orbital hematoma or other pathology. Paranasal sinuses are otherwise clear.  No mastoid effusion. CT CERVICAL SPINE FINDINGS Alignment: Vertebral bodies normally aligned with preservation of the normal cervical lordosis. No listhesis. Skull base and vertebrae: Skullbase intact. Normal C1-2 articulations preserved. Dens is intact. Vertebral body heights maintained. No acute fracture identified. Soft tissues and spinal canal: Visualized soft tissues of the neck demonstrate no acute abnormality. No prevertebral edema. Vascular calcifications present about the carotid bifurcations. Disc levels: Moderate degenerative spondylolysis noted at C5-6. Multilevel facet arthrosis noted, most notable at C4-5 on the left. Upper chest: Unremarkable. IMPRESSION: CT BRAIN: 1. No acute intracranial process identified. 2. Acute right orbital floor fracture with suspected superimposed radiopaque foreign body, incompletely evaluated on this exam. Further evaluation with dedicated maxillofacial CT recommended. 3. Acute scalp contusion/laceration at the left vertex. CT CERVICAL SPINE: No acute traumatic injury within the cervical spine. Electronically Signed   By: Jeannine Boga M.D.   On: 04/09/2016 03:27   Ct Chest W Contrast  Result Date: 04/09/2016 CLINICAL DATA:  Initial evaluation for acute trauma, fall. EXAM: CT CHEST, ABDOMEN, AND PELVIS WITH CONTRAST TECHNIQUE: Multidetector CT imaging of the chest, abdomen and pelvis was performed following the standard protocol during bolus administration of intravenous contrast. CONTRAST:  115m ISOVUE-300 IOPAMIDOL (ISOVUE-300) INJECTION 61% COMPARISON:  Prior ultrasound from 03/07/2016. FINDINGS: CT CHEST FINDINGS Cardiovascular: Intrathoracic aorta of normal caliber without acute abnormality. Mild to moderate for age atheromatous plaque. Aortic valvular calcifications noted as well. Visualized great vessels within normal limits. Heart size within normal limits. No pericardial effusion. Scattered coronary artery calcifications noted. Limited evaluation the pulmonary arteries grossly unremarkable. Mediastinum/Nodes: No pathologically enlarged mediastinal, hilar, or axillary lymph nodes identified. Esophagus within normal limits. Lungs/Pleura: Dependent atelectasis present within the lower lobes bilaterally. Superimposed mild subpleural fibrotic lung changes. No focal infiltrates. No evidence for pulmonary contusion. No pulmonary edema or pleural effusion. No pneumothorax. No worrisome pulmonary nodule or mass. Musculoskeletal: No acute osseous abnormality identified. No worrisome lytic or blastic osseous lesions. CT ABDOMEN PELVIS FINDINGS Hepatobiliary: Liver within normal limits. Gallbladder normal. No biliary dilatation. Pancreas: Pancreas within normal limits. Spleen: Spleen within normal limits. Adrenals/Urinary Tract: Adrenal glands are normal. Kidneys equal in size with symmetric enhancement. No nephrolithiasis, hydronephrosis, or focal enhancing renal mass. No hydroureter. Bladder within normal limits. Stomach/Bowel: Small hiatal hernia noted. Stomach otherwise unremarkable. No evidence for bowel obstruction or acute  bowel injury. No acute inflammatory changes seen about the bowels. Vascular/Lymphatic: Normal intravascular enhancement seen throughout the intra-abdominal aorta and its branch vessels. Moderate atherosclerosis. No adenopathy. Reproductive: Uterus and ovaries within normal limits. Other: Small fat containing paraumbilical hernia. No free intraperitoneal air. No free fluid. No mesenteric or retroperitoneal hematoma. Musculoskeletal: No acute osseous abnormality. No fracture. No worrisome lytic or blastic osseous lesions. Multilevel degenerate spondylolysis noted within the visualized spine. External soft tissues within normal limits. IMPRESSION: 1. No CT evidence for acute traumatic injury within the chest, abdomen, and pelvis. 2. No other acute abnormality identified. Electronically Signed   By: BPincus BadderD.  On: 04/09/2016 03:42   Ct Cervical Spine Wo Contrast  Result Date: 04/09/2016 CLINICAL DATA:  Initial evaluation for acute trauma, fall. EXAM: CT HEAD WITHOUT CONTRAST CT CERVICAL SPINE WITHOUT CONTRAST TECHNIQUE: Multidetector CT imaging of the head and cervical spine was performed following the standard protocol without intravenous contrast. Multiplanar CT image reconstructions of the cervical spine were also generated. COMPARISON:  None available. FINDINGS: CT HEAD FINDINGS Brain: Age appropriate cerebral atrophy present. No acute intracranial hemorrhage. No evidence for acute large vessel territory infarct. No mass lesion, midline shift or mass effect. No hydrocephalus. No extra-axial fluid collection. Vascular: No hyperdense vessel. Scattered vascular calcifications present within the carotid siphons. Skull: Soft tissue contusion with laceration present at the left scalp near the vertex. Calvarium intact. Sinuses/Orbits: There is an acute right orbital floor fracture. There appears to be a a superimposed linear radiopaque foreign body extending through the right maxillary sinus and  inferior aspect of the right orbit (series 6, image 22). Layering blood within the right maxillary sinus. Globes intact. No retro-orbital hematoma or other pathology. Paranasal sinuses are otherwise clear.  No mastoid effusion. CT CERVICAL SPINE FINDINGS Alignment: Vertebral bodies normally aligned with preservation of the normal cervical lordosis. No listhesis. Skull base and vertebrae: Skullbase intact. Normal C1-2 articulations preserved. Dens is intact. Vertebral body heights maintained. No acute fracture identified. Soft tissues and spinal canal: Visualized soft tissues of the neck demonstrate no acute abnormality. No prevertebral edema. Vascular calcifications present about the carotid bifurcations. Disc levels: Moderate degenerative spondylolysis noted at C5-6. Multilevel facet arthrosis noted, most notable at C4-5 on the left. Upper chest: Unremarkable. IMPRESSION: CT BRAIN: 1. No acute intracranial process identified. 2. Acute right orbital floor fracture with suspected superimposed radiopaque foreign body, incompletely evaluated on this exam. Further evaluation with dedicated maxillofacial CT recommended. 3. Acute scalp contusion/laceration at the left vertex. CT CERVICAL SPINE: No acute traumatic injury within the cervical spine. Electronically Signed   By: Jeannine Boga M.D.   On: 04/09/2016 03:27   Ct Abdomen Pelvis W Contrast  Result Date: 04/09/2016 CLINICAL DATA:  Initial evaluation for acute trauma, fall. EXAM: CT CHEST, ABDOMEN, AND PELVIS WITH CONTRAST TECHNIQUE: Multidetector CT imaging of the chest, abdomen and pelvis was performed following the standard protocol during bolus administration of intravenous contrast. CONTRAST:  164m ISOVUE-300 IOPAMIDOL (ISOVUE-300) INJECTION 61% COMPARISON:  Prior ultrasound from 03/07/2016. FINDINGS: CT CHEST FINDINGS Cardiovascular: Intrathoracic aorta of normal caliber without acute abnormality. Mild to moderate for age atheromatous plaque.  Aortic valvular calcifications noted as well. Visualized great vessels within normal limits. Heart size within normal limits. No pericardial effusion. Scattered coronary artery calcifications noted. Limited evaluation the pulmonary arteries grossly unremarkable. Mediastinum/Nodes: No pathologically enlarged mediastinal, hilar, or axillary lymph nodes identified. Esophagus within normal limits. Lungs/Pleura: Dependent atelectasis present within the lower lobes bilaterally. Superimposed mild subpleural fibrotic lung changes. No focal infiltrates. No evidence for pulmonary contusion. No pulmonary edema or pleural effusion. No pneumothorax. No worrisome pulmonary nodule or mass. Musculoskeletal: No acute osseous abnormality identified. No worrisome lytic or blastic osseous lesions. CT ABDOMEN PELVIS FINDINGS Hepatobiliary: Liver within normal limits. Gallbladder normal. No biliary dilatation. Pancreas: Pancreas within normal limits. Spleen: Spleen within normal limits. Adrenals/Urinary Tract: Adrenal glands are normal. Kidneys equal in size with symmetric enhancement. No nephrolithiasis, hydronephrosis, or focal enhancing renal mass. No hydroureter. Bladder within normal limits. Stomach/Bowel: Small hiatal hernia noted. Stomach otherwise unremarkable. No evidence for bowel obstruction or acute bowel injury. No acute inflammatory changes seen  about the bowels. Vascular/Lymphatic: Normal intravascular enhancement seen throughout the intra-abdominal aorta and its branch vessels. Moderate atherosclerosis. No adenopathy. Reproductive: Uterus and ovaries within normal limits. Other: Small fat containing paraumbilical hernia. No free intraperitoneal air. No free fluid. No mesenteric or retroperitoneal hematoma. Musculoskeletal: No acute osseous abnormality. No fracture. No worrisome lytic or blastic osseous lesions. Multilevel degenerate spondylolysis noted within the visualized spine. External soft tissues within normal  limits. IMPRESSION: 1. No CT evidence for acute traumatic injury within the chest, abdomen, and pelvis. 2. No other acute abnormality identified. Electronically Signed   By: Jeannine Boga M.D.   On: 04/09/2016 03:42   Ct Maxillofacial Wo Contrast  Result Date: 04/09/2016 CLINICAL DATA:  76 year old female with fall and right orbital fracture. EXAM: CT MAXILLOFACIAL WITHOUT CONTRAST TECHNIQUE: Multidetector CT imaging of the maxillofacial structures was performed. Multiplanar CT image reconstructions were also generated. A small metallic BB was placed on the right temple in order to reliably differentiate right from left. COMPARISON:  CT of the head dated 04/09/2016 FINDINGS: Osseous: A linear, blade like, radiopaque foreign object noted extending from the right anterior facial soft tissues through the anterior wall of the right maxillary sinus and right orbital floor. The tip of the foreign object is in the inferior aspect of the right orbit inferior to the inferior rectus muscle. Do bb no other acute facial bone fractures identified. Orbits: There is small right orbital emphysema. No fluid collection or hematoma. The globe appears intact. The retro-orbital fat are preserved. Correlation with clinical exam is recommended to exclude ocular entrapment. Sinuses: There is partial opacification of the right maxillary sinus with high attenuating content and air-fluid levels compatible with hemosinus. The remainder of the paranasal sinuses and mastoid air cells are clear. Soft tissues: Laceration of the right anterior facial soft tissue at the site of the penetrating injury. Linear radiopaque densities along the tract of the penetrating object noted. No fluid collection or large hematoma. Limited intracranial: No significant or unexpected finding. IMPRESSION: Penetrating injury through the right anterior face with a linear and blade like structure extending through the fractures of the anterior wall of the  right maxillary sinus and right orbital floor. The tip of the penetrating object is located in the inferior aspect of the right orbit of inferior to the inferior rectus muscle. Correlation with clinical exam recommended to exclude ocular entrapment. No orbital hematoma or traumatic ocular injury. Right maxillary hemosinus. Electronically Signed   By: Anner Crete M.D.   On: 04/09/2016 05:47    Review of Systems  Respiratory: Positive for cough. Negative for chest tightness.  Cardiovascular: Positive for syncope. Negative for chest pain.  Gastrointestinal: Positive for nausea. Negative for vomiting.  Musculoskeletal: Positive for back pain.  Skin: Positive for wound.  Neurological: Positive for dizziness, syncopeand light-headedness.  All other systems reviewed and are negative.  Blood pressure 122/80, pulse 87, temperature 100.2 F (37.9 C), temperature source Oral, resp. rate 18, SpO2 94 %. Physical Exam Constitutional: She is oriented to person, place, and time. She appears well-developedand well-nourished.  Head: Laceration to left frontal scalp. Small laceration of the right frontal scalp. Abrasion lateral to the right eye. Laceration to upper lip where it meets the nose. Laceration just to the right of the nasal ala. Eyes: EOMare normal. Pupils are equal, round, and reactive to light. Vision grossly intact. Ears: Normal auricles and EACs. Mouth: Normal mucosa. Cardiovascular: Normal rate, regular rhythmand normal heart sounds.  Pulmonary/Chest: Effort normaland breath sounds normal.  She has no wheezes. She has no rales. She exhibits no tenderness.  Abdominal: Soft. Bowel sounds are normal. She exhibits no distensionand no mass. There is no tenderness.  Musculoskeletal: Normal range of motion. She exhibits no edema.  Lymphadenopathy: She has no cervical adenopathy.  Neurological: She is alertand oriented to person, place, and time. No cranial nerve deficit. She exhibits  normal muscle tone. Coordinationnormal.  Skin: Skin is warmand dry. No rashnoted.  Psychiatric: She has a normal mood and affect. Her behavior is normal. Judgmentand thought contentnormal.  Nursing noteand vitalsreviewed.  Assessment/Plan: Large embedded right facial foreign body, causing fractures and penetration of her right anterior maxillary wall and right orbital floor. Plan removal in the OR under sedation. The R/B/A/D of the procedure are reviewed with the patient. Inform consent obtained.

## 2016-04-09 NOTE — ED Notes (Signed)
Son present aware to patient to outpatient surgery here at [facility].  Dressing to face secured. Reviewed d/c papers aware to have staples removed with 7-10 days and review d/c papers with patient and son.

## 2016-04-09 NOTE — ED Notes (Signed)
Same day surgery called and aware of pt coming to them for surgery.

## 2016-04-09 NOTE — ED Notes (Signed)
Morphine given after Dr. Shannan Harper tried to remove glass from below right eye.  Unable to remove large piece. Shared with patient will need to go to surgery to have glass removed. 4x4 applied to area with min blood present.  Rates pain after med given 5.

## 2016-04-09 NOTE — ED Notes (Signed)
Patient walked to bathroom, unsteady gait.  Patient is nauseated after getting up to go to bathroom.

## 2016-04-09 NOTE — ED Notes (Signed)
Patient transported to CT 

## 2016-04-09 NOTE — Anesthesia Preprocedure Evaluation (Signed)
Anesthesia Evaluation  Patient identified by MRN, date of birth, ID band Patient awake    Reviewed: Allergy & Precautions, NPO status , Patient's Chart, lab work & pertinent test results  Airway Mallampati: II  TM Distance: >3 FB Neck ROM: Full    Dental no notable dental hx.    Pulmonary neg pulmonary ROS, shortness of breath, former smoker,    Pulmonary exam normal breath sounds clear to auscultation       Cardiovascular negative cardio ROS Normal cardiovascular exam Rhythm:Regular Rate:Normal     Neuro/Psych  Headaches, negative psych ROS   GI/Hepatic negative GI ROS, Neg liver ROS,   Endo/Other  Hypothyroidism   Renal/GU negative Renal ROS     Musculoskeletal  (+) Arthritis , Fibromyalgia -  Abdominal   Peds  Hematology negative hematology ROS (+)   Anesthesia Other Findings   Reproductive/Obstetrics negative OB ROS                             Anesthesia Physical Anesthesia Plan  ASA: II  Anesthesia Plan: MAC   Post-op Pain Management:    Induction: Intravenous  Airway Management Planned:   Additional Equipment:   Intra-op Plan:   Post-operative Plan:   Informed Consent: I have reviewed the patients History and Physical, chart, labs and discussed the procedure including the risks, benefits and alternatives for the proposed anesthesia with the patient or authorized representative who has indicated his/her understanding and acceptance.   Dental advisory given  Plan Discussed with: CRNA  Anesthesia Plan Comments: (Evaluated in preop. Pt. Denies history of dysrhythmia or prior syncopal events. Pt apparently had viral illness and likely dehydrated. Pt. Discharged from ED. Evaluated by Dr. Roxanne Mins in the ED and did not feel further workup was warranted. )       Anesthesia Quick Evaluation

## 2016-04-09 NOTE — Discharge Instructions (Addendum)
Call your surgeon if you experience:   1.  Fever over 101.0. 2.  Inability to urinate. 3.  Nausea and/or vomiting. 4.  Extreme swelling or bruising at the surgical site. 5.  Continued bleeding from the incision. 6.  Increased pain, redness or drainage from the incision. 7.  Problems related to your pain medication. 8.  Any problems and/or concerns   Post Anesthesia Home Care Instructions  Activity: Get plenty of rest for the remainder of the day. A responsible adult should stay with you for 24 hours following the procedure.  For the next 24 hours, DO NOT: -Drive a car -Paediatric nurse -Drink alcoholic beverages -Take any medication unless instructed by your physician -Make any legal decisions or sign important papers.  Meals: Start with liquid foods such as gelatin or soup. Progress to regular foods as tolerated. Avoid greasy, spicy, heavy foods. If nausea and/or vomiting occur, drink only clear liquids until the nausea and/or vomiting subsides. Call your physician if vomiting continues.  Special Instructions/Symptoms: Your throat may feel dry or sore from the anesthesia or the breathing tube placed in your throat during surgery. If this causes discomfort, gargle with warm salt water. The discomfort should disappear within 24 hours.  If you had a scopolamine patch placed behind your ear for the management of post- operative nausea and/or vomiting:  1. The medication in the patch is effective for 72 hours, after which it should be removed.  Wrap patch in a tissue and discard in the trash. Wash hands thoroughly with soap and water. 2. You may remove the patch earlier than 72 hours if you experience unpleasant side effects which may include dry mouth, dizziness or visual disturbances. 3. Avoid touching the patch. Wash your hands with soap and water after contact with the patch.   ---------------  The patient may resume all her previous activities and diet. She should call my  office to schedule an appointment in approximately 1 week.    Post Anesthesia Home Care Instructions  Activity: Get plenty of rest for the remainder of the day. A responsible adult should stay with you for 24 hours following the procedure.  For the next 24 hours, DO NOT: -Drive a car -Paediatric nurse -Drink alcoholic beverages -Take any medication unless instructed by your physician -Make any legal decisions or sign important papers.  Meals: Start with liquid foods such as gelatin or soup. Progress to regular foods as tolerated. Avoid greasy, spicy, heavy foods. If nausea and/or vomiting occur, drink only clear liquids until the nausea and/or vomiting subsides. Call your physician if vomiting continues.  Special Instructions/Symptoms: Your throat may feel dry or sore from the anesthesia or the breathing tube placed in your throat during surgery. If this causes discomfort, gargle with warm salt water. The discomfort should disappear within 24 hours.  If you had a scopolamine patch placed behind your ear for the management of post- operative nausea and/or vomiting:  1. The medication in the patch is effective for 72 hours, after which it should be removed.  Wrap patch in a tissue and discard in the trash. Wash hands thoroughly with soap and water. 2. You may remove the patch earlier than 72 hours if you experience unpleasant side effects which may include dry mouth, dizziness or visual disturbances. 3. Avoid touching the patch. Wash your hands with soap and water after contact with the patch.

## 2016-04-09 NOTE — Op Note (Signed)
DATE OF PROCEDURE:  04/09/2016                              OPERATIVE REPORT  SURGEON:  Leta Baptist, MD  PREOPERATIVE DIAGNOSES: 1. Embedded right facial foreign bodies 2. Fractures of right anterior maxillary wall and right inferior orbital floor.  POSTOPERATIVE DIAGNOSES: 1. Embedded right facial foreign bodies.  2. Fractures of right anterior maxillary wall and right inferior orbital floor.  PROCEDURE PERFORMED:  Debridement and removal of deeply embedded right facial foreign bodies.  ANESTHESIA:  Local anesthesia with 1% lidocaine and IV sedation.  COMPLICATIONS:  None.  ESTIMATED BLOOD LOSS:  Minimal.  INDICATION FOR PROCEDURE:  Kelsey Warner is a 76 y.o. female who had a syncope episode last night, resulting in falling face first onto a glass wall. It resulted in significant facial trauma, multiple abrasions, and multiple facial lacerations. The patient was evaluated at the [facility] emergency room. Her facial CT scan showed an embedded foreign body within her right face. The foreign body was noted to penetrate through the anterior right maxillary wall and the right inferior orbital floor. The patient has no obvious visual deficit or entrapment.  Based on the above findings, the decision was made for the patient to undergo thabove-stated procedure. The risks, benefits, alternatives, and details of the procedure were discussed with the patient.  Questions were invited and answered.  Informed consent was obtained.  DESCRIPTION:  The patient was taken to the operating room and placed supine on the operating table.  IV sedation was started by the anesthesiologist. 1% lidocaine with 1-100,000 epinephrine was infiltrated around the right midface region. The patient was noted to have a 1-1/2 cm laceration over her right lower midface. The laceration site was carefully debrided. A large piece of glass foreign body was noted within the maxillary sinus, extending into the right orbital floor.   Multiple smaller glass pieces were also noted within the laceration site. All foreign bodies were carefully removed.  The surgical sites were copiously irrigated. The care of the patient was turned over to the anesthesiologist.  The patient was transferred to the recovery room in good condition.  OPERATIVE FINDINGS: Embedded right facial foreign bodies.  SPECIMEN:  None  FOLLOWUP CARE:  The patient will be discharged home once awake and alert.  The patient will follow up in my office in approximately 2 weeks.  Aodhan Scheidt,SUI W 04/09/2016 11:18 AM

## 2016-04-09 NOTE — ED Notes (Signed)
Patient complaining of pain.  MD aware, new orders per Dr Roxanne Mins.

## 2016-04-09 NOTE — ED Notes (Signed)
Patient leaving ED at this time.  [facility] Benjamine Mola.

## 2016-04-09 NOTE — ED Notes (Signed)
Pt states fell onto glass vase. Pt with multiple glass fragments in face. Pt denies any eye trauma or blurry/double vision.

## 2016-04-09 NOTE — Transfer of Care (Signed)
Immediate Anesthesia Transfer of Care Note  Patient: Kelsey Warner  Procedure(s) Performed: Procedure(s) with comments: REMOVAL FOREIGN BODY NASAL (N/A) -  removal foreign body from face   Patient Location: PACU  Anesthesia Type:MAC  Level of Consciousness: awake and alert   Airway & Oxygen Therapy: Patient Spontanous Breathing and Patient connected to face mask oxygen  Post-op Assessment: Report given to RN and Post -op Vital signs reviewed and stable  Post vital signs: Reviewed and stable  Last Vitals:  Vitals:   04/09/16 0855 04/09/16 1017  BP: 122/80 (!) 131/55  Pulse: 87 82  Resp:  17  Temp: 37.9 C 37.6 C    Last Pain:  Vitals:   04/09/16 1017  TempSrc: Oral  PainSc: 0-No pain         Complications: No apparent anesthesia complications

## 2016-04-09 NOTE — ED Triage Notes (Signed)
Per EMS: Pt walking to bathroom, had syncopal episode. Pt states does NOT remember fall, unknown down time. Pt a/o x 4 upon arrival. Pt complaining of head, back and neck pain. Pt denies any blood thinners. Pt with laceration to top mid of head. Pt with lac to R cheek. No obvious oral trauma.

## 2016-04-09 NOTE — ED Notes (Signed)
Patient back from CT.

## 2016-04-10 ENCOUNTER — Encounter (HOSPITAL_BASED_OUTPATIENT_CLINIC_OR_DEPARTMENT_OTHER): Payer: Self-pay | Admitting: Otolaryngology

## 2016-04-11 ENCOUNTER — Encounter (HOSPITAL_BASED_OUTPATIENT_CLINIC_OR_DEPARTMENT_OTHER): Payer: Self-pay | Admitting: Otolaryngology

## 2016-04-15 ENCOUNTER — Ambulatory Visit (INDEPENDENT_AMBULATORY_CARE_PROVIDER_SITE_OTHER): Payer: Medicare Other | Admitting: Emergency Medicine

## 2016-04-15 VITALS — BP 156/82 | HR 88 | Temp 98.6°F | Resp 16 | Ht 67.5 in | Wt 167.0 lb

## 2016-04-15 DIAGNOSIS — S060X1S Concussion with loss of consciousness of 30 minutes or less, sequela: Secondary | ICD-10-CM

## 2016-04-15 DIAGNOSIS — S0231XD Fracture of orbital floor, right side, subsequent encounter for fracture with routine healing: Secondary | ICD-10-CM | POA: Diagnosis not present

## 2016-04-15 DIAGNOSIS — S060X9D Concussion with loss of consciousness of unspecified duration, subsequent encounter: Secondary | ICD-10-CM

## 2016-04-15 DIAGNOSIS — S060X9A Concussion with loss of consciousness of unspecified duration, initial encounter: Secondary | ICD-10-CM

## 2016-04-15 HISTORY — DX: Concussion with loss of consciousness of unspecified duration, initial encounter: S06.0X9A

## 2016-04-15 NOTE — Progress Notes (Signed)
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   Chief Complaint  Patient presents with  . Headache    Knot on head that is sore and causing visual disturbances.    HISTORY OF PRESENT ILLNESS: This is a 76 y.o. female s/p syncope and resultant face and head injury 1 week ago, c/o headache since along with intermittent episodes of mild blurring of vision; drove in today; states her balance is ok, doesn't have total recollection of what happened; has a "knot" on her left scalp area where staples were placed; overall feels better than last week but still symptomatic. States she had 2 ct scans of her head while in the Hospital.  HPI   Prior to Admission medications   Medication Sig Start Date End Date Taking? Authorizing Provider  aspirin 81 MG tablet Take 81 mg by mouth daily.     Yes Historical Provider, MD  Cholecalciferol (VITAMIN D-3 PO) Take 1 tablet by mouth daily. TAKE 1 TAB DAILY    Yes Historical Provider, MD  KRILL OIL PO Take 1 tablet by mouth.   Yes Historical Provider, MD  levothyroxine (SYNTHROID, LEVOTHROID) 112 MCG tablet TAKE 1 TABLET BY MOUTH EVERY DAY BEFORE BREAKFAST 12/31/15  Yes Chelle Jeffery, PA-C  LYRICA 50 MG capsule TAKE 1 CAPSULE BY MOUTH THREE TIMES DAILY 04/01/16  Yes Chelle Jeffery, PA-C  MAGNESIUM PO Take 1 capsule by mouth.   Yes Historical Provider, MD  oxyCODONE-acetaminophen (ROXICET) 5-325 MG tablet Take 1 tablet by mouth every 4 (four) hours as needed for severe pain. 04/09/16  Yes Leta Baptist, MD  pyridOXINE (VITAMIN B-6) 100 MG tablet Take 100 mg by mouth daily.   Yes Historical Provider, MD  valACYclovir (VALTREX) 1000 MG tablet TAKE 2 TABLETS BY MOUTH TWICE DAILY FOR HERPES SIMPLEX ON LIP AS DIRECTED Patient taking differently: Take 2,000 mg by mouth 2 (two) times daily as needed (outbreaks). TAKE 2 TABLETS BY MOUTH TWICE DAILY FOR HERPES SIMPLEX ON LIP AS DIRECTED 09/12/15  Yes Chelle Jeffery, PA-C  vitamin B-12 (CYANOCOBALAMIN) 1000 MCG tablet Take 1,000 mcg by mouth daily.      Yes Historical Provider, MD  carisoprodol (SOMA) 250 MG tablet Take 1 tablet (250 mg total) by mouth daily as needed (muscle spasm). Patient not taking: Reported on 04/15/2016 09/12/15   Harrison Mons, PA-C  rizatriptan (MAXALT) 5 MG tablet Take 1 tablet (5 mg total) by mouth as needed for migraine. May repeat in 2 hours if needed Patient not taking: Reported on 04/15/2016 09/12/15   Harrison Mons, PA-C    Allergies  Allergen Reactions  . Adhesive [Tape] Other (See Comments)    blisters  . Other     Rolilox  . Penicillins     REACTION: hives, rash  . Statins Other (See Comments)    Tried multiple statins, all caused muscle aches. 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    Patient Active Problem List   Diagnosis Date Noted  . Migraine 09/12/2015  . Fever blister 09/12/2015  . BMI 28.0-28.9,adult 09/12/2015  . Peripheral neuropathy ([place]) 09/12/2015  . Osteopenia 09/12/2015  . Varicose veins 09/12/2015  . 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 Hyperlipidemia 09/12/2015  . Hypothyroid 12/19/2011  . Dyspnea 01/02/2011    Past Medical History:  Diagnosis Date  . Elevated LFTs   . Fatty liver disease, nonalcoholic   . Fibromyalgia   . Hyperlipidemia   . Migraine   .  Neuromuscular disorder ([place])   . Osteopenia   . Thyroid disease    hypothyroid  . Varicose veins     Past Surgical History:  Procedure Laterality Date  . APPENDECTOMY    . BREAST SURGERY     biopsy  . FOREIGN BODY REMOVAL Right 04/09/2016   Procedure: REMOVAL FOREIGN BODY RIGHT FACE;  Surgeon: Leta Baptist, MD;  Location: [facility];  Service: ENT;  Laterality: Right;   removal foreign body from face   . ROTATOR CUFF REPAIR    . TONSILLECTOMY    . TUBAL LIGATION      Social History   Social History  . Marital status: Widowed    Spouse name: n/a  . Number of children: 3  . Years of education:  11th grade   Occupational History  . 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 Smoking status: Former Smoker    Packs/day: 0.80    Years: 20.00    Types: Cigarettes    Quit date: 03/18/1977  . Smokeless tobacco: Never Used  . Alcohol use No  . Drug use: No  . Sexual activity: Yes    Partners: Male    Birth control/ protection: Post-menopausal   Other Topics Concern  . Not on file   Social History Narrative   Pt lives alone.    Husband died of pancreatic cancer at age 70.   Her boyfriend lives in [place].   Her adult children live in [place] and [place].    Family History  Problem Relation Age of Onset  . Pulmonary fibrosis Father   . Heart disease Father   . Hyperlipidemia Father   . Heart disease Maternal Grandmother   . Diabetes Maternal Grandmother   . Hypertension Maternal Grandmother   . Stroke Maternal Grandmother   . Mental illness Mother   . Mental illness Sister   . Cancer Sister   . COPD Sister   . Pulmonary fibrosis Paternal Uncle      Review of Systems  Constitutional: Negative.  Negative for chills and fever.  HENT: Negative.  Negative for ear discharge, ear pain, hearing loss, nosebleeds, sinus pain and sore throat.   Eyes: Negative.  Negative for blurred vision, double vision, pain, discharge and redness.  Respiratory: Negative.  Negative for cough, hemoptysis and shortness of breath.   Cardiovascular: Negative.  Negative for chest pain and palpitations.  Gastrointestinal: Negative.  Negative for abdominal pain, diarrhea, nausea and vomiting.  Genitourinary: Negative.  Negative for dysuria and hematuria.  Skin: Negative for rash.  Neurological: Positive for dizziness and headaches. Negative for tingling, sensory change, speech change, focal weakness and seizures.  Endo/Heme/Allergies: Negative.   All other systems reviewed and are negative.  Vitals:   04/15/16 1740  BP: (!) 156/82  Pulse: 88    Resp: 16  Temp: 98.6 F (37 C)     Physical Exam  Constitutional: She is oriented to person, place, and time. She appears well-developed and well-nourished.  HENT:  Head: Normocephalic.    Nose: Nose normal.  Mouth/Throat: Oropharynx is clear and moist. No oropharyngeal exudate.  1-Left scalp hematoma with staples in place, no infection. 2- right periorbital and cheek STS with bruising.  Eyes: Conjunctivae and EOM are normal. Pupils are equal, round, and reactive to light.  Neck: Normal range of motion. Neck supple.  Cardiovascular: Normal rate, regular rhythm and normal heart sounds.   Pulmonary/Chest: Effort normal and  breath sounds normal.  Abdominal: Soft. She exhibits no distension. There is no tenderness.  Musculoskeletal: Normal range of motion.  Neurological: She is alert and oriented to person, place, and time. She displays normal reflexes. No cranial nerve deficit or sensory deficit. She exhibits normal muscle tone. Coordination normal.  Skin: Skin is warm and dry. Capillary refill takes less than 2 seconds.  Psychiatric: She has a normal mood and affect. Her behavior is normal.  Vitals reviewed.    ASSESSMENT & PLAN: Y[place]ay was seen today for headache.  Diagnoses and all orders for this visit:  Concussion with loss of consciousness of 30 minutes or less, sequela ([place]) -     CT Head Wo Contrast; Future -     Ambulatory referral to Neurology  Closed fracture of right orbital floor with routine healing, subsequent encounter  Concussion with loss of consciousness, subsequent encounter    Patient Instructions       IF you received an x-ray today, you will receive an invoice from [facility]. Please contact [facility] at [phone] with questions or concerns regarding your invoice.   IF you received labwork today, you will receive an invoice from [facility]. Please contact [facility] at [phone] with questions or concerns regarding your  invoice.   Our billing staff will not be able to assist you with questions regarding bills from these companies.  You will be contacted with the lab results as soon as they are available. The fastest way to get your results is to activate your My Chart account. Instructions are located on the last page of this paperwork. If you have not heard from [place] regarding the results in 2 weeks, please contact this office.      Concussion, Adult A concussion is a brain injury. It is caused by:  A hit to the head.  A quick and sudden movement (jolt) of the head or neck. A concussion is usually not life threatening. Even so, it can cause serious problems. If you had a concussion before, you may have concussion-like problems after a hit to your head. Follow these instructions at home: General instructions  Follow your doctor's directions carefully.  Take medicines only as told by your doctor.  Only take medicines your doctor says are safe.  Do not drink alcohol until your doctor says it is okay. Alcohol and some drugs can slow down healing. They can also put you at risk for further injury.  If you are having trouble remembering things, write them down.  Try to do one thing at a time if you get distracted easily. For example, do not watch TV while making dinner.  Talk to your family members or close friends when making important decisions.  Follow up with your doctor as told.  Watch your symptoms. Tell others to do the same. Serious problems can sometimes happen after a concussion. Older adults are more likely to have these problems.  Tell your teachers, school nurse, school counselor, coach, Product/process development scientist, or work Freight forwarder about your concussion. Tell them about what you can or cannot do. They should watch to see if:  It gets even harder for you to pay attention or concentrate.  It gets even harder for you to remember things or learn new things.  You need more time than normal to finish  things.  You become annoyed (irritable) more than before.  You are not able to deal with stress as well.  You have more problems than before.  Rest. Make sure  you:  Get plenty of sleep at night.  Go to sleep early.  Go to bed at the same time every day. Try to wake up at the same time.  Rest during the day.  Take naps when you feel tired.  Limit activities where you have to think a lot or concentrate. These include:  Doing homework.  Doing work related to a job.  Watching TV.  Using the computer. Returning To Your Regular Activities  Return to your normal activities slowly, not all at once. You must give your body and brain enough time to heal.  Do not play sports or do other athletic activities until your doctor says it is okay.  Ask your doctor when you can drive, ride a bicycle, or work other vehicles or machines. Never do these things if you feel dizzy.  Ask your doctor about when you can return to work or school. Preventing Another Concussion  It is very important to avoid another brain injury, especially before you have healed. In rare cases, another injury can lead to permanent brain damage, brain swelling, or death. The risk of this is greatest during the first 7-10 days after your injury. Avoid injuries by:  Wearing a seat belt when riding in a car.  Not drinking too much alcohol.  Avoiding activities that could lead to a second concussion (such as contact sports).  Wearing a helmet when doing activities like:  Biking.  Skiing.  Skateboarding.  Skating.  Making your home safer by:  Removing things from the floor or stairways that could make you trip.  Using grab bars in bathrooms and handrails by stairs.  Placing non-slip mats on floors and in bathtubs.  Improve lighting in dark areas. Contact a doctor if:  It gets even harder for you to pay attention or concentrate.  It gets even harder for you to remember things or learn new  things.  You need more time than normal to finish things.  You become annoyed (irritable) more than before.  You are not able to deal with stress as well.  You have more problems than before.  You have problems keeping your balance.  You are not able to react quickly when you should. Get help if you have any of these problems for more than 2 weeks:  Lasting (chronic) headaches.  Dizziness or trouble balancing.  Feeling sick to your stomach (nausea).  Seeing (vision) problems.  Being affected by noises or light more than normal.  Feeling sad, low, down in the dumps, blue, gloomy, or empty (depressed).  Mood changes (mood swings).  Feeling of fear or nervousness about what may happen (anxiety).  Feeling annoyed.  Memory problems.  Problems concentrating or paying attention.  Sleep problems.  Feeling tired all the time. Get help right away if:  You have bad headaches or your headaches get worse.  You have weakness (even if it is in one hand, leg, or part of the face).  You have loss of feeling (numbness).  You feel off balance.  You keep throwing up (vomiting).  You feel tired.  One black center of your eye (pupil) is larger than the other.  You twitch or shake violently (convulse).  Your speech is not clear (slurred).  You are more confused, easily angered (agitated), or annoyed than before.  You have more trouble resting than before.  You are unable to recognize people or places.  You have neck pain.  It is difficult to wake you up.  You  have unusual behavior changes.  You pass out (lose consciousness). This information is not intended to replace advice given to you by your health care provider. Make sure you discuss any questions you have with your health care provider. Document Released: 02/20/2009 Document Revised: 08/10/2015 Document Reviewed: 09/24/2012 Elsevier Interactive Patient Education  2017 [employer]      Agustina Caroli, MD [facility]

## 2016-04-15 NOTE — Patient Instructions (Addendum)
IF you received an x-ray today, you will receive an invoice from [facility]. Please contact [facility] at [phone] with questions or concerns regarding your invoice.   IF you received labwork today, you will receive an invoice from [place]. Please contact [facility] at [phone] with questions or concerns regarding your invoice.   Our billing staff will not be able to assist you with questions regarding bills from these companies.  You will be contacted with the lab results as soon as they are available. The fastest way to get your results is to activate your My Chart account. Instructions are located on the last page of this paperwork. If you have not heard from [place] regarding the results in 2 weeks, please contact this office.      Concussion, Adult A concussion is a brain injury. It is caused by:  A hit to the head.  A quick and sudden movement (jolt) of the head or neck. A concussion is usually not life threatening. Even so, it can cause serious problems. If you had a concussion before, you may have concussion-like problems after a hit to your head. Follow these instructions at home: General instructions  Follow your doctor's directions carefully.  Take medicines only as told by your doctor.  Only take medicines your doctor says are safe.  Do not drink alcohol until your doctor says it is okay. Alcohol and some drugs can slow down healing. They can also put you at risk for further injury.  If you are having trouble remembering things, write them down.  Try to do one thing at a time if you get distracted easily. For example, do not watch TV while making dinner.  Talk to your family members or close friends when making important decisions.  Follow up with your doctor as told.  Watch your symptoms. Tell others to do the same. Serious problems can sometimes happen after a concussion. Older adults are more likely to have these problems.  Tell your  teachers, school nurse, school counselor, coach, Product/process development scientist, or work Freight forwarder about your concussion. Tell them about what you can or cannot do. They should watch to see if:  It gets even harder for you to pay attention or concentrate.  It gets even harder for you to remember things or learn new things.  You need more time than normal to finish things.  You become annoyed (irritable) more than before.  You are not able to deal with stress as well.  You have more problems than before.  Rest. Make sure you:  Get plenty of sleep at night.  Go to sleep early.  Go to bed at the same time every day. Try to wake up at the same time.  Rest during the day.  Take naps when you feel tired.  Limit activities where you have to think a lot or concentrate. These include:  Doing homework.  Doing work related to a job.  Watching TV.  Using the computer. Returning To Your Regular Activities  Return to your normal activities slowly, not all at once. You must give your body and brain enough time to heal.  Do not play sports or do other athletic activities until your doctor says it is okay.  Ask your doctor when you can drive, ride a bicycle, or work other vehicles or machines. Never do these things if you feel dizzy.  Ask your doctor about when you can return to work or school. Preventing Another Concussion  It  is very important to avoid another brain injury, especially before you have healed. In rare cases, another injury can lead to permanent brain damage, brain swelling, or death. The risk of this is greatest during the first 7-10 days after your injury. Avoid injuries by:  Wearing a seat belt when riding in a car.  Not drinking too much alcohol.  Avoiding activities that could lead to a second concussion (such as contact sports).  Wearing a helmet when doing activities like:  Biking.  Skiing.  Skateboarding.  Skating.  Making your home safer by:  Removing things  from the floor or stairways that could make you trip.  Using grab bars in bathrooms and handrails by stairs.  Placing non-slip mats on floors and in bathtubs.  Improve lighting in dark areas. Contact a doctor if:  It gets even harder for you to pay attention or concentrate.  It gets even harder for you to remember things or learn new things.  You need more time than normal to finish things.  You become annoyed (irritable) more than before.  You are not able to deal with stress as well.  You have more problems than before.  You have problems keeping your balance.  You are not able to react quickly when you should. Get help if you have any of these problems for more than 2 weeks:  Lasting (chronic) headaches.  Dizziness or trouble balancing.  Feeling sick to your stomach (nausea).  Seeing (vision) problems.  Being affected by noises or light more than normal.  Feeling sad, low, down in the dumps, blue, gloomy, or empty (depressed).  Mood changes (mood swings).  Feeling of fear or nervousness about what may happen (anxiety).  Feeling annoyed.  Memory problems.  Problems concentrating or paying attention.  Sleep problems.  Feeling tired all the time. Get help right away if:  You have bad headaches or your headaches get worse.  You have weakness (even if it is in one hand, leg, or part of the face).  You have loss of feeling (numbness).  You feel off balance.  You keep throwing up (vomiting).  You feel tired.  One black center of your eye (pupil) is larger than the other.  You twitch or shake violently (convulse).  Your speech is not clear (slurred).  You are more confused, easily angered (agitated), or annoyed than before.  You have more trouble resting than before.  You are unable to recognize people or places.  You have neck pain.  It is difficult to wake you up.  You have unusual behavior changes.  You pass out (lose  consciousness). This information is not intended to replace advice given to you by your health care provider. Make sure you discuss any questions you have with your health care provider. Document Released: 02/20/2009 Document Revised: 08/10/2015 Document Reviewed: 09/24/2012 Elsevier Interactive Patient Education  2017 [employer].

## 2016-04-17 ENCOUNTER — Ambulatory Visit (HOSPITAL_COMMUNITY)
Admission: RE | Admit: 2016-04-17 | Discharge: 2016-04-17 | Disposition: A | Discharge status: Home / Self Care | Payer: Medicare Other | Source: Ambulatory Visit | Attending: Emergency Medicine | Admitting: Emergency Medicine

## 2016-04-17 DIAGNOSIS — R51 Headache: Secondary | ICD-10-CM | POA: Diagnosis not present

## 2016-04-17 DIAGNOSIS — R930 Abnormal findings on diagnostic imaging of skull and head, not elsewhere classified: Secondary | ICD-10-CM | POA: Insufficient documentation

## 2016-04-17 DIAGNOSIS — S060X1S Concussion with loss of consciousness of 30 minutes or less, sequela: Secondary | ICD-10-CM | POA: Diagnosis not present

## 2016-04-17 DIAGNOSIS — X58XXXA Exposure to other specified factors, initial encounter: Secondary | ICD-10-CM | POA: Insufficient documentation

## 2016-04-17 DIAGNOSIS — S0990XA Unspecified injury of head, initial encounter: Secondary | ICD-10-CM | POA: Diagnosis not present

## 2016-04-18 ENCOUNTER — Telehealth: Payer: Self-pay

## 2016-04-18 NOTE — Telephone Encounter (Signed)
Pt states that her CT scan is on mychart, but she would like a call back to explain what she has read  Best number [phone]

## 2016-04-19 NOTE — Telephone Encounter (Signed)
Please advise 

## 2016-04-22 ENCOUNTER — Telehealth: Payer: Self-pay | Admitting: Emergency Medicine

## 2016-04-22 NOTE — Telephone Encounter (Signed)
Spoke to patient and advised regarding her condition. Also went over CT brain results. We requested Neurology consultation for evaluation of concussion. She stated they can't get her in until April and that is unacceptable. She had a head injury and was in the Hospital for 2 days. She should be seen by Neuro ASAP. What can we do to expedite this consult? Thanks.

## 2016-04-22 NOTE — Telephone Encounter (Signed)
Spoke to patient today. See telephone encounter.

## 2016-04-23 NOTE — Progress Notes (Signed)
No worries. No need to call. I spoke to her yesterday and explained everything. We are already working on the Neurology referral. Thanks for your help.

## 2016-04-23 NOTE — Telephone Encounter (Signed)
Please advise 

## 2016-04-23 NOTE — Telephone Encounter (Signed)
Pt advised.

## 2016-04-23 NOTE — Telephone Encounter (Signed)
Originally referred to [facility]. Called [facility] and they have scheduled the patient for this Friday 2/9 at 8am. Pt is aware. Would like to know if she is okay to drive to [place], [place] this weekend to visit family.

## 2016-04-23 NOTE — Telephone Encounter (Signed)
I would not recommend driving but traveling as a passenger should be ok.

## 2016-04-23 NOTE — Telephone Encounter (Signed)
Can you tell who we referred to and can we get an urgent appointment?

## 2016-04-24 ENCOUNTER — Other Ambulatory Visit: Payer: Self-pay | Admitting: Physician Assistant

## 2016-04-24 DIAGNOSIS — S0121XD Laceration without foreign body of nose, subsequent encounter: Secondary | ICD-10-CM | POA: Diagnosis not present

## 2016-04-26 ENCOUNTER — Ambulatory Visit (INDEPENDENT_AMBULATORY_CARE_PROVIDER_SITE_OTHER): Payer: Medicare Other | Admitting: Diagnostic Neuroimaging

## 2016-04-26 ENCOUNTER — Encounter: Payer: Self-pay | Admitting: Diagnostic Neuroimaging

## 2016-04-26 VITALS — BP 137/73 | HR 62 | Ht 67.5 in | Wt 167.2 lb

## 2016-04-26 DIAGNOSIS — I6310 Cerebral infarction due to embolism of unspecified precerebral artery: Secondary | ICD-10-CM | POA: Diagnosis not present

## 2016-04-26 DIAGNOSIS — R4020 Unspecified coma: Secondary | ICD-10-CM

## 2016-04-26 DIAGNOSIS — R9089 Other abnormal findings on diagnostic imaging of central nervous system: Secondary | ICD-10-CM

## 2016-04-26 DIAGNOSIS — F0781 Postconcussional syndrome: Secondary | ICD-10-CM | POA: Diagnosis not present

## 2016-04-26 DIAGNOSIS — I639 Cerebral infarction, unspecified: Secondary | ICD-10-CM | POA: Diagnosis not present

## 2016-04-26 NOTE — Patient Instructions (Signed)
Thank you for coming to see us at [facility]. I hope we have been able to provide you high quality care today.  You may receive a patient satisfaction survey over the next few weeks. We would appreciate your feedback and comments so that we may continue to improve ourselves and the health of our patients.  - check MRI brain - check echocardiogram - check carotid ultrasound - check EEG - continue lipid management  - monitor BP - refer to physical therapy - use cane or other assistive device - no driving until event free x 6 months  - According to [place] law, you can not drive unless you are syncope (fainting) or seizure free for at least 6 months and under physician's care.   - Please maintain syncope/seizure precautions. Do not participate in activities where a loss of awareness could harm you or someone else. No swimming alone, no tub bathing, no hot tubs, no driving, no operating motorized vehicles (cars, ATVs, motocycles, etc), lawnmowers or power tools. No standing at heights, such as rooftops, ladders or stairs. Avoid hot objects such as stoves, heaters, open fires. Wear a helmet when riding a bicycle, scooter, skateboard, etc. and avoid areas of traffic. Set your water heater to 120 degrees or less.   ~~~~~~~~~~~~~~~~~~~~~~~~~~~~~~~~~~~~~~~~~~~~~~~~~~~~~~~~~~~~~~~~~  DR. PENUMALLI'S GUIDE TO HAPPY AND HEALTHY LIVING These are some of my general health and wellness recommendations. Some of them may apply to you better than others. Please use common sense as you try these suggestions and feel free to ask me any questions.   ACTIVITY/FITNESS Mental, social, emotional and physical stimulation are very important for brain and body health. Try learning a new activity (arts, music, language, sports, games).  Keep moving your body to the best of your abilities. You can do this at home, inside or outside, the park, community center, gym or anywhere you like. Consider a  physical therapist or personal trainer to get started. Consider the app Sworkit. Fitness trackers such as smart-watches, smart-phones or Fitbits can help as well.   NUTRITION Eat more plants: colorful vegetables, nuts, seeds and berries.  Eat less sugar, salt, preservatives and processed foods.  Avoid toxins such as cigarettes and alcohol.  Drink water when you are thirsty. Warm water with a slice of lemon is an excellent morning drink to start the day.  Consider these websites for more information The Nutrition Source (https://www.hsph.[employer].edu/nutritionsource) Precision Nutrition (www.precisionnutrition.com/blog/infographics)   RELAXATION Consider practicing mindfulness meditation or other relaxation techniques such as deep breathing, prayer, yoga, tai chi, massage. See website mindful.org or the apps Headspace or Calm to help get started.   SLEEP Try to get at least 7-8+ hours sleep per day. Regular exercise and reduced caffeine will help you sleep better. Practice good sleep hygeine techniques. See website sleep.org for more information.   PLANNING Prepare estate planning, living will, healthcare POA documents. Sometimes this is best planned with the help of an attorney. Theconversationproject.org and agingwithdignity.org are excellent resources.  

## 2016-04-26 NOTE — Progress Notes (Addendum)
[facility]  PATIENT: Kelsey Warner DOB: [birth year]  REFERRING CLINICIAN: Agustina Caroli HISTORY FROM: patient REASON FOR VISIT: new consult    HISTORICAL  CHIEF COMPLAINT:  Chief Complaint  Patient presents with  . Concussion w/LOC    rm 6, New Pt, 2 wks ago blacked out at 11 pm, fell w/face and head injuries, LOC ~ 20-25 min; continued numbness right side of face; rhinorrhea ever since fall"    HISTORY OF PRESENT ILLNESS:   76 year old right-handed female here for evaluation of concussion.   04/07/16, in the evening, patient had vertigo lasting for one hour.   04/08/16 , patient was not feeling well. She was having body aches, headache, feeling bad all over. At 8 PM patient went to sleep, which is early for her. At 11:15 PM, patient woke up to go to the bathroom. She stood up, which is her habit before walking so that she doesn't fall down, but then remembers waking up on the floor. Apparently she had collapsed for approximately 20-25 minutes. She apparently fell face first into a glass door. She did not have any tongue biting. Patient had bruising in her arms, legs, shoulders, right eye. She was incontinent of urine. She was able to call her son who advised her to call 911. Patient arrived to the emergency room just after midnight. Patient had a glass shard in her right maxillary region which was removed by ENT. Patient was discharged by 1 PM the next day.  Patient has been having blurred vision, foggy sensation, cloudy thinking, balance and walking difficulty. Symptoms are slightly improving but persistent.  Patient had follow-up CT scan of the head on 04/17/16 due to persistent symptoms, and was found to have a left basal ganglia hypodensity, suspicious for subacute ischemic infarction, which was not apparent on initial CT from 04/09/16.  Patient has no prior history of seizure. Patient has no prior history of stroke. Patient has had at least 5 fainting  spells as a teenager. No family history of seizure. No other prior head traumas, meningitis, CNS problems. Patient lives alone. She has hypercholesterolemia but is intolerant of statins. 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   REVIEW OF SYSTEMS: Full 14 system review of systems performed and negative with exception of: Weight loss fatigue blurred vision easy bruising incontinence cramps aching muscles running nose.  ALLERGIES: Allergies  Allergen Reactions  . Adhesive [Tape] Other (See Comments)    blisters  . Other     Rolilox  . Penicillins     REACTION: hives, rash  . Statins Other (See Comments)    Tried multiple statins, all caused muscle aches. 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    HOME MEDICATIONS: Outpatient Medications Prior to Visit  Medication Sig Dispense Refill  . aspirin 81 MG tablet Take 81 mg by mouth daily.      . carisoprodol (SOMA) 250 MG tablet Take 1 tablet (250 mg total) by mouth daily as needed (muscle spasm). 30 tablet 0  . Cholecalciferol (VITAMIN D-3 PO) Take 1 tablet by mouth daily. TAKE 1 TAB DAILY     . KRILL OIL PO Take 1 tablet by mouth.    . levothyroxine (SYNTHROID, LEVOTHROID) 112 MCG tablet TAKE 1 TABLET BY MOUTH EVERY DAY BEFORE BREAKFAST 90 tablet 1  . LYRICA 50 MG capsule TAKE 1 CAPSULE BY MOUTH THREE TIMES DAILY 90 capsule 0  . MAGNESIUM PO Take 1 capsule by mouth.    Marland Kitchen  pyridOXINE (VITAMIN B-6) 100 MG tablet Take 100 mg by mouth daily.    . vitamin B-12 (CYANOCOBALAMIN) 1000 MCG tablet Take 1,000 mcg by mouth daily.      Marland Kitchen oxyCODONE-acetaminophen (ROXICET) 5-325 MG tablet Take 1 tablet by mouth every 4 (four) hours as needed for severe pain. (Patient not taking: Reported on 04/26/2016) 25 tablet 0  . rizatriptan (MAXALT) 5 MG tablet Take 1 tablet (5 mg total) by mouth as needed for migraine. May repeat in 2 hours if needed (Patient not taking: Reported on 04/15/2016) 5 tablet 2  .  valACYclovir (VALTREX) 1000 MG tablet TAKE 2 TABLETS BY MOUTH TWICE DAILY FOR HERPES SIMPLEX ON LIP AS DIRECTED (Patient not taking: Reported on 04/26/2016) 20 tablet 2   No facility-administered medications prior to visit.     PAST MEDICAL HISTORY: Past Medical History:  Diagnosis Date  . Elevated LFTs   . Fatty liver disease, nonalcoholic   . Fibromyalgia   . Hyperlipidemia   . Migraine   . Neuromuscular disorder ([place])   . Osteopenia   . Thyroid disease    hypothyroid  . Varicose veins     PAST SURGICAL HISTORY: Past Surgical History:  Procedure Laterality Date  . APPENDECTOMY    . BREAST SURGERY     biopsy  . FOREIGN BODY REMOVAL Right 04/09/2016   Procedure: REMOVAL FOREIGN BODY RIGHT FACE;  Surgeon: Leta Baptist, MD;  Location: [facility];  Service: ENT;  Laterality: Right;   removal foreign body from face   . ganglian cyst Right    right hand  . ROTATOR CUFF REPAIR Left   . TONSILLECTOMY    . TUBAL LIGATION      FAMILY HISTORY: Family History  Problem Relation Age of Onset  . Pulmonary fibrosis Father   . Heart disease Father   . Hyperlipidemia Father   . Heart disease Maternal Grandmother   . Diabetes Maternal Grandmother   . Hypertension Maternal Grandmother   . Stroke Maternal Grandmother   . Mental illness Mother   . Mental illness Sister   . Cancer Sister   . COPD Sister   . Pulmonary fibrosis Paternal Uncle     SOCIAL HISTORY:  Social History   Social History  . Marital status: Widowed    Spouse name: n/a  . Number of children: 3  . Years of education: 12   Occupational History  . 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 Smoking status: Former Smoker    Packs/day: 0.80    Years: 20.00    Types: Cigarettes    Quit date: 03/18/1977  . Smokeless tobacco: Never Used  . Alcohol use No  . Drug use: No  . Sexual activity: Yes    Partners: Male    Birth control/ protection:  Post-menopausal   Other Topics Concern  . Not on file   Social History Narrative   Pt lives alone.    Husband died of pancreatic cancer at age 95.   Her boyfriend lives in [place].   Her adult children live in [place] and [place].   Caffeine- coffee, 2 cups daily     PHYSICAL EXAM  GENERAL EXAM/CONSTITUTIONAL: Vitals:  Vitals:   04/26/16 0823  BP: 137/73  Pulse: 62  Weight: 167 lb 3.2 oz (75.8 kg)  Height: 5' 7.5" (1.715 m)     Body mass index is 25.8 kg/m.  Visual Acuity Screening  Right eye Left eye Both eyes  Without correction: 20/100 20/100   With correction:     Comments: 04/26/16 usually wears one contact    Patient is in no distress; well developed, nourished and groomed; neck is supple  CARDIOVASCULAR:  Examination of carotid arteries is normal; no carotid bruits  Regular rate and rhythm, no murmurs  Examination of peripheral vascular system by observation and palpation is normal  EYES:  Ophthalmoscopic exam of optic discs and posterior segments is normal; no papilledema or hemorrhages  MUSCULOSKELETAL:  Gait, strength, tone, movements noted in Neurologic exam below  NEUROLOGIC: MENTAL STATUS:  No flowsheet data found.  awake, alert, oriented to person, place and time  recent and remote memory intact  normal attention and concentration  language fluent, comprehension intact, naming intact,   fund of knowledge appropriate  CRANIAL NERVE:   2nd - no papilledema on fundoscopic exam  2nd, 3rd, 4th, 6th - pupils equal and reactive to light, visual fields full to confrontation, extraocular muscles intact, no nystagmus  5th - facial sensation --> DECR IN RIGHT V2  7th - facial strength symmetric  8th - hearing intact  9th - palate elevates symmetrically, uvula midline  11th - shoulder shrug symmetric  12th - tongue protrusion midline  MOTOR:   normal bulk and tone, full strength in the BUE, BLE  SENSORY:   normal and symmetric  to light touch, pinprick, temperature, vibration, EXCEPT:  NORMAL PROPRIOCEPTION  ABSENT VIB AT TOES  ABSENT PP IN FEET UP TO SHINS (LEFT WORSE THAN RIGHT)  COORDINATION:   finger-nose-finger, fine finger movements normal  REFLEXES:   deep tendon reflexes TRACE and symmetric; ABSENT AT ANKLES; DOWN GOING TOES  GAIT/STATION:   narrow based gait; UNSTEADY GAIT; CANNOT WALK ON TOES OR HEELS (STAGGERS); NEG ROMBERG    DIAGNOSTIC DATA (LABS, IMAGING, TESTING) - I reviewed patient records, labs, notes, testing and imaging myself where available.  Lab Results  Component Value Date   WBC 8.6 04/09/2016   HGB 12.1 04/09/2016   HCT 36.0 04/09/2016   MCV 89.8 04/09/2016   PLT 216 04/09/2016      Component Value Date/Time   NA 134 (L) 04/09/2016 0117   K 3.9 04/09/2016 0117   CL 102 04/09/2016 0117   CO2 23 04/09/2016 0117   GLUCOSE 139 (H) 04/09/2016 0117   BUN 11 04/09/2016 0117   CREATININE 0.79 04/09/2016 0117   CREATININE 0.69 09/12/2015 0958   CALCIUM 9.0 04/09/2016 0117   PROT 7.5 09/12/2015 0958   ALBUMIN 4.2 09/12/2015 0958   AST 23 09/12/2015 0958   ALT 31 (H) 09/12/2015 0958   ALKPHOS 77 09/12/2015 0958   BILITOT 0.3 09/12/2015 0958   GFRNONAA >60 04/09/2016 0117   GFRNONAA >89 04/29/2014 1023   GFRAA >60 04/09/2016 0117   GFRAA >89 04/29/2014 1023   Lab Results  Component Value Date   CHOL 308 (H) 04/29/2014   HDL 62 04/29/2014   LDLCALC 216 (H) 04/29/2014   TRIG 149 04/29/2014   CHOLHDL 5.0 04/29/2014   No results found for: HGBA1C Lab Results  Component Value Date   VITAMINB12 1,007 (H) 12/19/2011   Lab Results  Component Value Date   TSH 2.64 09/12/2015    04/17/16 CT head [I reviewed images myself and agree with interpretation. -VRP]  - Poorly defined low-attenuation area in the left gait basal ganglia consistent with subacute or acute lacunar infarct. No hemorrhage.  04/09/16 CT maxillofacial [I reviewed images myself and  agree with  interpretation. -VRP]  - Penetrating injury through the right anterior face with a linear and blade like structure extending through the fractures of the anterior wall of the right maxillary sinus and right orbital floor. The tip of the penetrating object is located in the inferior aspect of the right orbit of inferior to the inferior rectus muscle. Correlation with clinical exam recommended to exclude ocular entrapment. No orbital hematoma or traumatic ocular injury.  - Right maxillary hemosinus.  04/09/16 CT head [I reviewed images myself and agree with interpretation. -VRP]  1. No acute intracranial process identified. 2. Acute right orbital floor fracture with suspected superimposed radiopaque foreign body, incompletely evaluated on this exam. Further evaluation with dedicated maxillofacial CT recommended. 3. Acute scalp contusion/laceration at the left vertex.  04/09/16 CT CERVICAL SPINE - No acute traumatic injury within the cervical spine.    ASSESSMENT AND PLAN  76 y.o. year old female here with new onset episode of loss of consciousness, followed by significant face, head trauma, postconcussion syndrome, also found to have hypodensity in the left basal ganglia. Unclear etiology of initial cause of syncope. This could have been due to orthostatic hypotension, stroke, seizure or some other cause. Although left basal ganglia infarct should not cause syncope, patient may have had vertebrobasilar insufficiency, cardioembolic source of stroke and therefore MRI of the brain will help further evaluate these possibilities.  Ddx: syncope vs seizure vs stroke; plus concussion and post-concussion syndrome  1. Loss of consciousness ([place])   2. Abnormal CT of brain   3. Cerebrovascular accident (CVA), unspecified mechanism ([place])   4. Cerebrovascular accident (CVA) due to embolism of precerebral artery ([place])   5. Post concussion syndrome      PLAN: STROKE WORKUP - check MRI brain, MRA  head/neck - check echocardiogram (TTE); if negative then may consider TEE and loop recorder (pending MRI results)  STROKE PREVENTION - continue lipid mgmt per PCP (patient is intolerant of statins; LDL 212; optimize nutrition, fitness and consider non-statin med mgmt such as PCSK9 inhibitor) - monitor BP - continue aspirin - consider sleep study to rule out sleep apnea  SEIZURE WORKUP - check EEG (and MRI brain)  POST CONCUSSION SYNDROME - gradually increase activities as tolerated - reviewed nutrition, exercise, rest strategies  GAIT DIFFICULTY - refer to physical therapy - use cane or other assistive device  SAFETY - According to [place] law, you can not drive unless you are syncope pr seizure free for at least 6 months and under physician's care.  - Please maintain seizure precautions. Do not participate in activities where a loss of awareness could harm you or someone else. No swimming alone, no tub bathing, no hot tubs, no driving, no operating motorized vehicles (cars, ATVs, motocycles, etc), lawnmowers or power tools. No standing at heights, such as rooftops, ladders or stairs. Avoid hot objects such as stoves, heaters, open fires. Wear a helmet when riding a bicycle, scooter, skateboard, etc. and avoid areas of traffic. Set your water heater to 120 degrees or less.   Orders Placed This Encounter  Procedures  . MR BRAIN W WO CONTRAST  . MR MRA HEAD WO CONTRAST  . MR MRA NECK W WO CONTRAST  . Ambulatory referral to Physical Therapy  . ECHOCARDIOGRAM COMPLETE  . EEG adult   Return in about 3 months (around 07/24/2016).  I reviewed images, labs, notes, records myself. I summarized findings and reviewed with patient, for this high risk condition (stroke vs seizure vs syncope)  requiring high complexity decision making.     Penni Bombard, MD [id number], [id number] AM Certified in Neurology, Neurophysiology and Neuroimaging  [facility] [address], [place], [place] [address] [phone]

## 2016-04-30 ENCOUNTER — Ambulatory Visit (INDEPENDENT_AMBULATORY_CARE_PROVIDER_SITE_OTHER): Payer: Medicare Other | Admitting: Physician Assistant

## 2016-04-30 ENCOUNTER — Encounter: Payer: Self-pay | Admitting: Physician Assistant

## 2016-04-30 VITALS — BP 133/72 | HR 91 | Temp 97.7°F | Ht 67.5 in | Wt 168.0 lb

## 2016-04-30 DIAGNOSIS — N3946 Mixed incontinence: Secondary | ICD-10-CM | POA: Diagnosis not present

## 2016-04-30 DIAGNOSIS — Z23 Encounter for immunization: Secondary | ICD-10-CM | POA: Insufficient documentation

## 2016-04-30 DIAGNOSIS — L723 Sebaceous cyst: Secondary | ICD-10-CM

## 2016-04-30 DIAGNOSIS — I6310 Cerebral infarction due to embolism of unspecified precerebral artery: Secondary | ICD-10-CM | POA: Diagnosis not present

## 2016-04-30 DIAGNOSIS — G609 Hereditary and idiopathic neuropathy, unspecified: Secondary | ICD-10-CM

## 2016-04-30 DIAGNOSIS — E038 Other specified hypothyroidism: Secondary | ICD-10-CM

## 2016-04-30 MED ORDER — PREGABALIN 50 MG PO CAPS: 50.0000 mg | ORAL_CAPSULE | Freq: Three times a day (TID) | ORAL | 5 refills | Status: DC

## 2016-04-30 NOTE — Progress Notes (Signed)
Patient ID: Kelsey Warner, female    DOB: July 13, 1940, 76 y.o.   MRN: [id number]  PCP: Harrison Mons, PA-C  Chief Complaint  Patient presents with  . Medication Refill    Lyrica    Subjective:   Presents for Lyrica refill.  She tolerates it well and finds that it works well to control the symptoms of peripheral neuropathy, but notes it is very costly.  3 weeks ago she experienced a loss of consciousness and fell through a glass door. She sustained significant injury to her face requiring surgical removal of a large piece of glass from the face just under the RIGHT eye. She continues to follow-up with neurology for post-concussive headache and has persistent LEFT-sided facial numbness.  She and her boyfriend recently split. Her youngest son has recently been diagnosed with terminal liver cancer.  Urinary leakage with cough, sneeze. Worsening and now with urge incontinence since the event 3 weeks ago.  A lump on the LEFT abdominal wall seems bigger since she has lost some weight and she'd like it re-examined.  LEFT groin cyst, previously I&D'd has recurred, and is irritated by the elastic the leg leg opening of her underwear. No redness or drainage.   Depression screen [facility] 2/9 04/30/2016 04/15/2016 09/12/2015 04/29/2014  Decreased Interest 0 0 0 0  Down, Depressed, Hopeless 0 0 0 0  PHQ - 2 Score 0 0 0 0     Review of Systems As above.    Patient Active Problem List   Diagnosis Date Noted  . Concussion with loss of consciousness 04/15/2016  . Migraine 09/12/2015  . Fever blister 09/12/2015  . BMI 28.0-28.9,adult 09/12/2015  . Peripheral neuropathy ([place]) 09/12/2015  . Osteopenia 09/12/2015  . Varicose veins 09/12/2015  . 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 Hyperlipidemia 09/12/2015  . Hypothyroid 12/19/2011  . Dyspnea 01/02/2011     Prior to Admission medications   Medication Sig Start  Date End Date Taking? Authorizing Provider  aspirin 81 MG tablet Take 81 mg by mouth daily.     Yes Historical Provider, MD  carisoprodol (SOMA) 250 MG tablet Take 1 tablet (250 mg total) by mouth daily as needed (muscle spasm). 09/12/15  Yes Elody Kleinsasser, PA-C  Cholecalciferol (VITAMIN D-3 PO) Take 1 tablet by mouth daily. TAKE 1 TAB DAILY    Yes Historical Provider, MD  KRILL OIL PO Take 1 tablet by mouth.   Yes Historical Provider, MD  levothyroxine (SYNTHROID, LEVOTHROID) 112 MCG tablet TAKE 1 TABLET BY MOUTH EVERY DAY BEFORE BREAKFAST 04/24/16  Yes Kamea Dacosta, PA-C  LYRICA 50 MG capsule TAKE 1 CAPSULE BY MOUTH THREE TIMES DAILY 04/01/16  Yes Markisha Meding, PA-C  MAGNESIUM PO Take 1 capsule by mouth.   Yes Historical Provider, MD  oxyCODONE-acetaminophen (ROXICET) 5-325 MG tablet Take 1 tablet by mouth every 4 (four) hours as needed for severe pain. 04/09/16  Yes Leta Baptist, MD  pyridOXINE (VITAMIN B-6) 100 MG tablet Take 100 mg by mouth daily.   Yes Historical Provider, MD  rizatriptan (MAXALT) 5 MG tablet Take 1 tablet (5 mg total) by mouth as needed for migraine. May repeat in 2 hours if needed 09/12/15  Yes Eddis Pingleton, PA-C  valACYclovir (VALTREX) 1000 MG tablet TAKE 2 TABLETS BY MOUTH TWICE DAILY FOR HERPES SIMPLEX ON LIP AS DIRECTED 09/12/15  Yes Trust Leh, PA-C  vitamin B-12 (CYANOCOBALAMIN) 1000 MCG tablet Take 1,000 mcg  by mouth daily.     Yes Historical Provider, MD     Allergies  Allergen Reactions  . Adhesive [Tape] Other (See Comments)    blisters  . Other     Rolilox  . Penicillins     REACTION: hives, rash  . Statins Other (See Comments)    Tried multiple statins, all caused muscle aches. 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       Objective:  Physical Exam  Constitutional: She is oriented to person, place, and time. She appears well-developed and well-nourished. She is active and cooperative. No distress.  BP 133/72 (BP  Location: Right Arm, Patient Position: Sitting, Cuff Size: Small)   Pulse 91   Temp 97.7 F (36.5 C) (Oral)   Ht 5' 7.5" (1.715 m)   Wt 168 lb (76.2 kg)   SpO2 97%   BMI 25.92 kg/m   HENT:  Head: Normocephalic and atraumatic.  Right Ear: Hearing normal.  Left Ear: Hearing normal.  Eyes: Conjunctivae are normal. No scleral icterus.  Neck: Normal range of motion. Neck supple. No thyromegaly present.  Cardiovascular: Normal rate, regular rhythm and normal heart sounds.   Pulses:      Radial pulses are 2+ on the right side, and 2+ on the left side.  Pulmonary/Chest: Effort normal and breath sounds normal.  Lymphadenopathy:       Head (right side): No tonsillar, no preauricular, no posterior auricular and no occipital adenopathy present.       Head (left side): No tonsillar, no preauricular, no posterior auricular and no occipital adenopathy present.    She has no cervical adenopathy.       Right: No supraclavicular adenopathy present.       Left: No supraclavicular adenopathy present.  Neurological: She is alert and oriented to person, place, and time. No sensory deficit.  Skin: Skin is warm, dry and intact. No rash noted. No cyanosis or erythema. Nails show no clubbing.     Psychiatric: She has a normal mood and affect. Her speech is normal and behavior is normal.           Assessment & Plan:   1. Idiopathic peripheral neuropathy She is happy with the results of the Lyrica, so doesn't want to try gabapentin. She doesn't qualify for coupons/savings cards because she has Medicare. - pregabalin (LYRICA) 50 MG capsule; Take 1 capsule (50 mg total) by mouth 3 (three) times daily.  Dispense: 90 capsule; Refill: 5  2. Mixed stress and urge urinary incontinence Await UCx. She isn't interested in medication for the urge incontinence component. Counseled on kegels's, etc. - Urine culture  3. Sebaceous cyst Appears stable. Not fluctuant. Not tender. Can be excised at her  convenience.  4. Other specified hypothyroidism Last TSH was normal. Update given recent events and adjust dose if indicated. - TSH  Return in about 6 months (around 10/28/2016) for re-evaluation of thyroid.    Fara Chute, PA-C Physician Assistant-Certified [facility]

## 2016-04-30 NOTE — Progress Notes (Signed)
Patient ID: Kelsey Warner, female    DOB: Jul 07, 1940, 76 y.o.   MRN: [id number]  PCP: Harrison Mons, PA-C  Chief Complaint  Patient presents with  . Medication Refill    Lyrica    Subjective:   Presents for medication refill.  Pt is a 76 yo Caucasian female who presents for refill of her Lyrica. Pt states that she takes her medication as directed and that it controls her peripheral neuropathy symptoms well. Pt wonders if there is any resources to decrease the cost of her medication.    Pt also mentions that she has had a rough time lately as she had an incident three weeks ago where she lost consciousness and fell into a glass door and she has had a surgery to remove glass from her face and work-ups through neurology. She has lasting left-sided face numbness and post-concussive headache from that event. Her boyfriend also recent broke up with her and her youngest son was diagnosed with terminal liver cancer. Denies suicidal ideation.  Bladder Leakage: Pt also complains of bladder leakage when she coughs or sneezes, which has gotten worse and is now associated with urge incontinence bladder leakage since the fall incident occurred.   Fatty Mass: Pt also complains of a fatty mass on her left abdomen that she has had for most of her adult life, but seems to have gotten bigger. She thinks it seems bigger because she has lost weight, but wants someone to look at it for her.   Cyst: Pt also complains of a cyst in her left groin. She states that she had it lanced and drained several years ago, but it seems to have come back now. It is in her panty line and is occasionally painful from friction. Denies drainage, warmth, or redness.   Review of Systems In addition to that stated in HPI above: Const: Denies fever, chills, or fatigue.  Pulm: Denies cough or SOB. CV: Denies chest pain or palpitations.  Abd: Denies nausea, vomiting, diarrhea, or constipation.    Patient Active Problem  List   Diagnosis Date Noted  . Concussion with loss of consciousness 04/15/2016  . Migraine 09/12/2015  . Fever blister 09/12/2015  . BMI 28.0-28.9,adult 09/12/2015  . Peripheral neuropathy ([place]) 09/12/2015  . Osteopenia 09/12/2015  . Varicose veins 09/12/2015  . 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 Hyperlipidemia 09/12/2015  . Hypothyroid 12/19/2011  . Dyspnea 01/02/2011     Prior to Admission medications   Medication Sig Start Date End Date Taking? Authorizing Provider  aspirin 81 MG tablet Take 81 mg by mouth daily.     Yes Historical Provider, MD  carisoprodol (SOMA) 250 MG tablet Take 1 tablet (250 mg total) by mouth daily as needed (muscle spasm). 09/12/15  Yes Chelle Jeffery, PA-C  Cholecalciferol (VITAMIN D-3 PO) Take 1 tablet by mouth daily. TAKE 1 TAB DAILY    Yes Historical Provider, MD  KRILL OIL PO Take 1 tablet by mouth.   Yes Historical Provider, MD  levothyroxine (SYNTHROID, LEVOTHROID) 112 MCG tablet TAKE 1 TABLET BY MOUTH EVERY DAY BEFORE BREAKFAST 04/24/16  Yes Chelle Jeffery, PA-C  LYRICA 50 MG capsule TAKE 1 CAPSULE BY MOUTH THREE TIMES DAILY 04/01/16  Yes Chelle Jeffery, PA-C  MAGNESIUM PO Take 1 capsule by mouth.   Yes Historical Provider, MD  oxyCODONE-acetaminophen (ROXICET) 5-325 MG tablet Take 1 tablet by mouth every 4 (four) hours as needed  for severe pain. 04/09/16  Yes Leta Baptist, MD  pyridOXINE (VITAMIN B-6) 100 MG tablet Take 100 mg by mouth daily.   Yes Historical Provider, MD  rizatriptan (MAXALT) 5 MG tablet Take 1 tablet (5 mg total) by mouth as needed for migraine. May repeat in 2 hours if needed 09/12/15  Yes Chelle Jeffery, PA-C  valACYclovir (VALTREX) 1000 MG tablet TAKE 2 TABLETS BY MOUTH TWICE DAILY FOR HERPES SIMPLEX ON LIP AS DIRECTED 09/12/15  Yes Chelle Jeffery, PA-C  vitamin B-12 (CYANOCOBALAMIN) 1000 MCG tablet Take 1,000 mcg by mouth daily.     Yes Historical Provider, MD      Allergies  Allergen Reactions  . Adhesive [Tape] Other (See Comments)    blisters  . Other     Rolilox  . Penicillins     REACTION: hives, rash  . Statins Other (See Comments)    Tried multiple statins, all caused muscle aches. 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       Objective:  Physical Exam Pulm: Good respiratory effort. CTAB. No wheezes, rales, or rhonchi. CV: RRR. No M/R/G. Abd: One 5 cm x 2 cm fatty mass in left upper quadrant of abdomen. Regular borders, mobile. Non-tender to palpation. Abdomen is soft, non-tender, non-distended. + BS x 4 quadrants. Skin: 2 cm x 1 cm mass in left groin. Mildly tender to palpation. No fluctuance, erythema, or warmth.      Assessment & Plan:   1. Idiopathic peripheral neuropathy Well controlled on current regimen. Pt advised to try coupons, but they are unlikely to work because of her government-provided insurance. Pt advised that if the cost of her medication becomes unaffordable or if she desires, to contact provider and can switch to Gabapentin. - pregabalin (LYRICA) 50 MG capsule; Take 1 capsule (50 mg total) by mouth 3 (three) times daily.  Dispense: 90 capsule; Refill: 5  2. Mixed stress and urge urinary incontinence Pt advised that if her symptoms begin to interfere with her life, we can prescribe Oxybutynin. - Urine culture  3. Sebaceous cyst Seems stable. Pt advised to return if becomes more worrisome to have it removed.   4. Other specified hypothyroidism Previous labs on 09/12/2015 were normal, but pt has experienced many recent stressful events. Pending lab. Currently on Synthroid 112 mcg QD. - TSH  Lorella Nimrod, PA-S

## 2016-04-30 NOTE — Patient Instructions (Signed)
     IF you received an x-ray today, you will receive an invoice from [facility]. Please contact [facility] at [phone] with questions or concerns regarding your invoice.   IF you received labwork today, you will receive an invoice from [facility]. Please contact [facility] at [phone] with questions or concerns regarding your invoice.   Our billing staff will not be able to assist you with questions regarding bills from these companies.  You will be contacted with the lab results as soon as they are available. The fastest way to get your results is to activate your My Chart account. Instructions are located on the last page of this paperwork. If you have not heard from us regarding the results in 2 weeks, please contact this office.     

## 2016-05-01 LAB — TSH: TSH: 0.384 u[IU]/mL — AB (ref 0.450–4.500)

## 2016-05-02 ENCOUNTER — Ambulatory Visit (INDEPENDENT_AMBULATORY_CARE_PROVIDER_SITE_OTHER): Payer: Medicare Other

## 2016-05-02 DIAGNOSIS — R4020 Unspecified coma: Secondary | ICD-10-CM

## 2016-05-02 DIAGNOSIS — I639 Cerebral infarction, unspecified: Secondary | ICD-10-CM

## 2016-05-02 DIAGNOSIS — R9089 Other abnormal findings on diagnostic imaging of central nervous system: Secondary | ICD-10-CM

## 2016-05-02 LAB — URINE CULTURE

## 2016-05-04 ENCOUNTER — Ambulatory Visit
Admission: RE | Admit: 2016-05-04 | Discharge: 2016-05-04 | Disposition: A | Discharge status: Home / Self Care | Payer: Medicare Other | Source: Ambulatory Visit | Attending: Diagnostic Neuroimaging | Admitting: Diagnostic Neuroimaging

## 2016-05-04 DIAGNOSIS — R9089 Other abnormal findings on diagnostic imaging of central nervous system: Secondary | ICD-10-CM | POA: Diagnosis not present

## 2016-05-04 DIAGNOSIS — R4020 Unspecified coma: Secondary | ICD-10-CM

## 2016-05-04 DIAGNOSIS — I6523 Occlusion and stenosis of bilateral carotid arteries: Secondary | ICD-10-CM | POA: Diagnosis not present

## 2016-05-04 DIAGNOSIS — I639 Cerebral infarction, unspecified: Secondary | ICD-10-CM

## 2016-05-04 MED ORDER — GADOBENATE DIMEGLUMINE 529 MG/ML IV SOLN
15.0000 mL | Freq: Once | INTRAVENOUS | Status: AC | PRN
  Administered 2016-05-04: 15 mL via INTRAVENOUS

## 2016-05-06 ENCOUNTER — Ambulatory Visit: Payer: Medicare Other | Attending: Diagnostic Neuroimaging | Admitting: Physical Therapy

## 2016-05-06 ENCOUNTER — Telehealth: Payer: Self-pay | Admitting: *Deleted

## 2016-05-06 DIAGNOSIS — R2689 Other abnormalities of gait and mobility: Secondary | ICD-10-CM | POA: Diagnosis not present

## 2016-05-06 DIAGNOSIS — R2681 Unsteadiness on feet: Secondary | ICD-10-CM | POA: Diagnosis not present

## 2016-05-06 DIAGNOSIS — R42 Dizziness and giddiness: Secondary | ICD-10-CM | POA: Insufficient documentation

## 2016-05-06 NOTE — Therapy (Signed)
[facility] [address] [place], [place], [address] Phone: [phone]   Fax:  [phone]  Physical Therapy Evaluation  Patient Details  Name: Kelsey Warner MRN: [id number] Date of Birth: 12/05/1940 Referring Provider: Dr. Leta Baptist  Encounter Date: 05/06/2016      PT End of Session - 05/06/16 1049    Visit Number 1   Number of Visits 12   Date for PT Re-Evaluation 06/17/16   Authorization Type Medicare- G Codes and Progress Notes   PT Start Time 1010   PT Stop Time 1046   PT Time Calculation (min) 36 min   Equipment Utilized During Treatment Gait belt   Activity Tolerance Patient tolerated treatment well   Behavior During Therapy WFL for tasks assessed/performed      Past Medical History:  Diagnosis Date  . Elevated LFTs   . Fatty liver disease, nonalcoholic   . Fibromyalgia   . Hyperlipidemia   . Migraine   . Neuromuscular disorder ([place])   . Osteopenia   . Thyroid disease    hypothyroid  . Varicose veins     Past Surgical History:  Procedure Laterality Date  . APPENDECTOMY    . BREAST SURGERY     biopsy  . FOREIGN BODY REMOVAL Right 04/09/2016   Procedure: REMOVAL FOREIGN BODY RIGHT FACE;  Surgeon: Leta Baptist, MD;  Location: [facility];  Service: ENT;  Laterality: Right;   removal foreign body from face   . ganglian cyst Right    right hand  . ROTATOR CUFF REPAIR Left   . TONSILLECTOMY    . TUBAL LIGATION      There were no vitals filed for this visit.       Subjective Assessment - 05/06/16 1011    Subjective Pt is a 76 y/o female who presents to OPPT with sudden onset of vertigo about 4 weeks ago.  Pt reports on 04/08/16 felt as if she was developing the flu so went to bed early.  Pt reports getting up in middle of night standing up then unable to recall any further incident.  Pt reports she passed out hitting face on glass side table.  Woke up about 30 min later and able to  get to phone to call family and emergency services.  Pt went to ED and had CT scan, with fx rib, and foreign object near R eye (glass).  Pt followed up with PCP, and then finally dx with concussion, and ordered CT scan of head then referred to neurologist.  Saw neurologist 04/26/16 and is in process of CVA workup.  Pt now presents to OPPT with c/o continued balance deficits.   Pertinent History fibromyalgia   Limitations Walking   Patient Stated Goals improve balance, walk without cane (not using at home)   Currently in Pain? No/denies            [facility] PT Assessment - 05/06/16 1019      Assessment   Medical Diagnosis CVA   Referring Provider Dr. Leta Baptist   Onset Date/Surgical Date 04/08/16   Hand Dominance Right   Next MD Visit PRN   Prior Therapy unrelated to this condition     Precautions   Precautions Fall     Restrictions   Weight Bearing Restrictions No     Balance Screen   Has the patient fallen in the past 6 months Yes   How many times? 1-due to CVA and syncope   Has the patient  had a decrease in activity level because of a fear of falling?  No   Is the patient reluctant to leave their home because of a fear of falling?  No     Home Environment   Living Environment Private residence   Living Arrangements Alone   Type of [place] to enter   Entrance Stairs-Number of Steps 4   Entrance Stairs-Rails Left   Home Layout Two level;Able to live on main level with bedroom/bathroom   [place] - single point     Prior Function   Level of Independence Independent   Vocation Retired   Biomedical scientist retired from [employer]   Leisure yardwork     Cognition   Overall Cognitive Status Within [place] for tasks assessed     Observation/Other Assessments   Focus on Therapeutic Outcomes (FOTO)  47 (53% limited; predicted 41% limited)   Neuro Quality of Life  Lower Extremity: 40.0     Strength   Overall Strength Comments  tested in sitting   Strength Assessment Site Hip;Knee;Ankle   Right/Left Hip Right;Left   Right Hip Flexion 4/5   Left Hip Flexion 4/5   Right/Left Knee Right;Left   Right Knee Flexion 4/5   Right Knee Extension 5/5   Left Knee Flexion 5/5   Left Knee Extension 5/5   Right/Left Ankle Left;Right   Right Ankle Dorsiflexion 4/5   Left Ankle Dorsiflexion 4/5     Ambulation/Gait   Ambulation/Gait Yes   Ambulation/Gait Assistance 5: Supervision   Ambulation Distance (Feet) 200 Feet   Assistive device Straight cane   Gait Pattern Scissoring;Trendelenburg   Gait velocity 2.03 ft/sec  20' = 9.84 sec     Standardized Balance Assessment   Standardized Balance Assessment Timed Up and Go Test     Timed Up and Go Test   Normal TUG (seconds) 15.65     Functional Gait  Assessment   Gait assessed  Yes   Gait Level Surface Walks 20 ft, slow speed, abnormal gait pattern, evidence for imbalance or deviates 10-15 in outside of the 12 in walkway width. Requires more than 7 sec to ambulate 20 ft.   Change in Gait Speed Makes only minor adjustments to walking speed, or accomplishes a change in speed with significant gait deviations, deviates 10-15 in outside the 12 in walkway width, or changes speed but loses balance but is able to recover and continue walking.   Gait with Horizontal Head Turns Performs head turns with moderate changes in gait velocity, slows down, deviates 10-15 in outside 12 in walkway width but recovers, can continue to walk.   Gait with Vertical Head Turns Performs task with slight change in gait velocity (eg, minor disruption to smooth gait path), deviates 6 - 10 in outside 12 in walkway width or uses assistive device   Gait and Pivot Turn Turns slowly, requires verbal cueing, or requires several small steps to catch balance following turn and stop   Step Over Obstacle Is able to step over one shoe box (4.5 in total height) but must slow down and adjust steps to clear box safely.  May require verbal cueing.   Gait with Narrow Base of Support Ambulates less than 4 steps heel to toe or cannot perform without assistance.   Gait with Eyes Closed Cannot walk 20 ft without assistance, severe gait deviations or imbalance, deviates greater than 15 in outside 12 in walkway width or will not attempt  task.   Ambulating Backwards Walks 20 ft, uses assistive device, slower speed, mild gait deviations, deviates 6-10 in outside 12 in walkway width.   Steps Alternating feet, must use rail.   Total Score 11                           PT Education - 05/06/16 1048    Education provided Yes   Education Details clinical findings, POC, goals of care, continue walking for exercise in safe environment   Person(s) Educated Patient   Methods Explanation   Comprehension Verbalized understanding             PT Long Term Goals - 05/06/16 1053      PT LONG TERM GOAL #1   Title independent with HEP (06/17/16)   Time 6   Period Weeks   Status New     PT LONG TERM GOAL #2   Title verbalize understanding of CVA risk factors/warning signs (06/17/16)   Time 6   Period Weeks   Status New     PT LONG TERM GOAL #3   Title improve FGA to >/= 18/30 for improved balance and mobility (06/17/16)   Time 6   Period Weeks   Status New     PT LONG TERM GOAL #4   Title improve gait velocity to > 2.62 ft/sec with LRAD for improved community access (06/17/16)   Time 6   Period Weeks   Status New     PT LONG TERM GOAL #5   Title improve timed up and go to < 13 sec for improved mobility and decreased fall risk (06/17/16)   Time 6   Period Weeks   Status New     PT LONG TERM GOAL #6   Title amb > 500' without device on various indoor/outdoor surfaces independently for improved functional mobility (06/17/16)   Time 6   Period Weeks   Status New               Plan - 05/06/16 1050    Clinical Impression Statement Pt is a 76 y/o female who presents to OPPT for moderate  complexity PT eval s/p CVA with fall and concussion.  Pt demonstrates mild lower extremity weakness, as well as gait abnormalities and decreased balance.  Pt also expresses dizziness with dynamic gait activities so will plan to further assess PRN.  Will benefit from PT to address these deficits.   Rehab Potential Good   Clinical Impairments Affecting Rehab Potential in process of CVA work up   PT Frequency 2x / week   PT Duration 6 weeks  anticipate d/c in 4-6 weeks   PT Treatment/Interventions ADLs/Self Care Home Management;Canalith Repostioning;Neuromuscular re-education;Balance training;Therapeutic exercise;Therapeutic activities;Functional mobility training;Stair training;Gait training;Patient/family education;Vestibular;DME Instruction   PT Next Visit Plan establish HEP (pt really wants to get started with exercises-hip strengthening, corner balance), SOT and vestibular assessment PRN   Consulted and Agree with Plan of Care Patient      Patient will benefit from skilled therapeutic intervention in order to improve the following deficits and impairments:  Abnormal gait, Decreased balance, Decreased mobility, Difficulty walking, Dizziness, Decreased strength  Visit Diagnosis: Unsteadiness on feet - Plan: PT plan of care cert/re-cert  Other abnormalities of gait and mobility - Plan: PT plan of care cert/re-cert  Dizziness and giddiness - Plan: PT plan of care cert/re-cert      G-Codes - [id number]    Functional Limitation Mobility:  Walking and moving around  FGA 11/30   Mobility: Walking and Moving Around Current Status [phone]) At least 60 percent but less than 80 percent impaired, limited or restricted   Mobility: Walking and Moving Around Goal Status [phone]) At least 20 percent but less than 40 percent impaired, limited or restricted       Problem List Patient Active Problem List   Diagnosis Date Noted  . Need for pneumococcal vaccination 04/30/2016  . Concussion with loss  of consciousness 04/15/2016  . Migraine 09/12/2015  . Fever blister 09/12/2015  . BMI 28.0-28.9,adult 09/12/2015  . Peripheral neuropathy ([place]) 09/12/2015  . Osteopenia 09/12/2015  . Varicose veins 09/12/2015  . 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 Hyperlipidemia 09/12/2015  . Hypothyroid 12/19/2011  . Dyspnea 01/02/2011       Laureen Abrahams, PT, DPT 05/06/16 11:01 AM   [facility] [address] [place], [place], [address] Phone: [phone]   Fax:  [phone]  Name: Kelsey Warner MRN: [id number] Date of Birth: 21-Oct-1940

## 2016-05-06 NOTE — Telephone Encounter (Signed)
Per Dr Rexene Alberts, spoke with patient and informed her that her MRI brain showed evolving stroke, no new issues. Advised her this is the stroke that was diagnosed in January when she had her head CT. Advised the MRA neck shows mild hardening of arteries with less than 50% stenosis in both carotid arteries, warranting no surgical evaluation. MRA head showed normal findings. Advised her per Dr Rexene Alberts, all in all, there are no new findings on these recent MRI and MRA studies, so she recommends patient to proceed as discussed by Dr. Leta Baptist, including medical management.  Reviewed Dr Gladstone Lighter recommendations with patient. She stated she had her first PT visit today.She has EEG and echocardiogram pending; she understands she will get a call with those results when available.  She verbalized understanding, appreciation of call.

## 2016-05-06 NOTE — Telephone Encounter (Signed)
MRI brain showed evolving stroke, no new issues, stroke was diagnosed in January when she had her head CT, MRA neck shows mild hardening of arteries with less than 50% stenosis in both carotid arteries, warranting no surgical evaluation. MRA head showed normal findings. All in all, no new findings on these recent MRI and MRA studies, proceed as discussed by Dr. Leta Baptist, including medical management.  Please call patient to notify.

## 2016-05-10 ENCOUNTER — Ambulatory Visit (HOSPITAL_COMMUNITY): Payer: Medicare Other | Attending: Cardiology

## 2016-05-10 ENCOUNTER — Telehealth: Payer: Self-pay | Admitting: *Deleted

## 2016-05-10 ENCOUNTER — Other Ambulatory Visit: Payer: Self-pay

## 2016-05-10 DIAGNOSIS — I5189 Other ill-defined heart diseases: Secondary | ICD-10-CM | POA: Diagnosis not present

## 2016-05-10 DIAGNOSIS — I639 Cerebral infarction, unspecified: Secondary | ICD-10-CM | POA: Diagnosis not present

## 2016-05-10 DIAGNOSIS — I6310 Cerebral infarction due to embolism of unspecified precerebral artery: Secondary | ICD-10-CM | POA: Diagnosis not present

## 2016-05-10 NOTE — Telephone Encounter (Signed)
Per Dr Leta Baptist, spoke with patient and informed her that her EEG is normal. Advised she follow Dr Gladstone Lighter advice from office visit AVS, monitor her symptoms, go to ED if head pains are severe or getting worse. She inquired about "what is next?" advised her that Dr Leta Baptist may consider loop recorder. She stated "I don;t want to get one if I don't need one." advised she can discuss with Dr Leta Baptist in her follow up visit and to call if she wants FU moved sooner. She verbalized understanding, appreciation of call.

## 2016-05-10 NOTE — Telephone Encounter (Signed)
Per Dr Leta Baptist, spoke with patient and formed her her echocardiogram results are unremarkable with no major findings.  She requested her EEG results from 05/02/16 and inquired as to what is the next step. She stated she is certain she does not have sleep apnea so no need for a sleep study. Reviewed Dr Gladstone Lighter plan note from her office visit. Advised she will get a call back to answer her questions.  She verbalized understanding, appreciation.

## 2016-05-13 ENCOUNTER — Ambulatory Visit: Payer: Medicare Other | Admitting: Physical Therapy

## 2016-05-13 ENCOUNTER — Encounter: Payer: Self-pay | Admitting: Diagnostic Neuroimaging

## 2016-05-13 DIAGNOSIS — R42 Dizziness and giddiness: Secondary | ICD-10-CM

## 2016-05-13 DIAGNOSIS — R2689 Other abnormalities of gait and mobility: Secondary | ICD-10-CM

## 2016-05-13 DIAGNOSIS — R2681 Unsteadiness on feet: Secondary | ICD-10-CM

## 2016-05-13 NOTE — Patient Instructions (Signed)
ABDUCTION: Standing - Resistance Band (Active)   Stand, feet flat. Against green resistance band, lift right leg out to side.  Repeat with other leg. Complete _1__ sets of _15__ repetitions. Perform _2-3__ sessions per day.  ADDUCTION: Standing - Stable: Resistance Band (Active)   Stand, right leg out to side as far as possible. Against green resistance band, draw leg in across midline. Repeat with other leg. Complete _1__ sets of _15__ repetitions. Perform __2-3_ sessions per day.  Strengthening: Hip Flexion - Resisted   With tubing around left ankle, anchor behind, bring leg forward, keeping knee straight.  Repeat with other leg. Repeat _15___ times per set. Do _1___ sets per session. Do __2-3__ sessions per day.  Strengthening: Hip Extension - Resisted   With tubing around right ankle, face anchor and pull leg straight back.  Repeat with other leg. Repeat _15___ times per set. Do __1__ sets per session. Do __2-3__ sessions per day.   Heel Raise: Bilateral (Standing)    Rise on balls of feet. Repeat __15__ times per set. Do _1___ sets per session. Do _2-3___ sessions per day.  http://orth.exer.us/39   Copyright  VHI. All rights reserved.

## 2016-05-13 NOTE — Therapy (Signed)
[facility] [address] [place], [place], [address] Phone: [phone]   Fax:  [phone]  Physical Therapy Treatment  Patient Details  Name: Kelsey Warner MRN: [id number] Date of Birth: Jul 26, 1940 Referring Provider: Dr. Leta Baptist  Encounter Date: 05/13/2016      PT End of Session - 05/13/16 1047    Visit Number 2   Number of Visits 12   Date for PT Re-Evaluation 06/17/16   Authorization Type Medicare- G Codes and Progress Notes   PT Start Time 1015   PT Stop Time 1054   PT Time Calculation (min) 39 min   Equipment Utilized During Treatment Gait belt   Activity Tolerance Patient tolerated treatment well   Behavior During Therapy WFL for tasks assessed/performed      Past Medical History:  Diagnosis Date  . Elevated LFTs   . Fatty liver disease, nonalcoholic   . Fibromyalgia   . Hyperlipidemia   . Migraine   . Neuromuscular disorder ([place])   . Osteopenia   . Thyroid disease    hypothyroid  . Varicose veins     Past Surgical History:  Procedure Laterality Date  . APPENDECTOMY    . BREAST SURGERY     biopsy  . FOREIGN BODY REMOVAL Right 04/09/2016   Procedure: REMOVAL FOREIGN BODY RIGHT FACE;  Surgeon: Leta Baptist, MD;  Location: [facility];  Service: ENT;  Laterality: Right;   removal foreign body from face   . ganglian cyst Right    right hand  . ROTATOR CUFF REPAIR Left   . TONSILLECTOMY    . TUBAL LIGATION      There were no vitals filed for this visit.      Subjective Assessment - 05/13/16 1019    Subjective doing well, increasing activity by walking more. reports "feeling funny" with increased activity as pt fatigues   Patient Stated Goals improve balance, walk without cane (not using at home)   Currently in Pain? No/denies                         [facility] Adult PT Treatment/Exercise - 05/13/16 1021      Ambulation/Gait   Ambulation/Gait Yes   Ambulation/Gait Assistance 7: Independent   Ambulation Distance (Feet) 500 Feet   Assistive device None   Gait Pattern Within Functional Limits   Ambulation Surface Level;Unlevel;Indoor;Outdoor;Paved;Grass     Exercises   Exercises Knee/Hip     Knee/Hip Exercises: Aerobic   Nustep L7 x 8 min     Knee/Hip Exercises: Standing   Hip Flexion Both;15 reps;Knee straight   Hip Flexion Limitations green theraband   Hip ADduction Both;15 reps   Hip ADduction Limitations green theraband   Hip Abduction Both;15 reps;Knee straight   Abduction Limitations green theraband   Hip Extension Both;15 reps;Knee straight   Extension Limitations green theraband                PT Education - 05/13/16 1046    Education provided Yes   Education Details HEP   Person(s) Educated Patient   Methods Explanation;Demonstration;Handout   Comprehension Verbalized understanding;Returned demonstration;Need further instruction             PT Long Term Goals - 05/13/16 1047      PT LONG TERM GOAL #1   Title independent with HEP (06/17/16)   Status On-going     PT LONG TERM GOAL #2   Title verbalize understanding  of CVA risk factors/warning signs (06/17/16)   Status On-going     PT LONG TERM GOAL #3   Title improve FGA to >/= 18/30 for improved balance and mobility (06/17/16)   Status On-going     PT LONG TERM GOAL #4   Title improve gait velocity to > 2.62 ft/sec with LRAD for improved community access (06/17/16)   Status On-going     PT LONG TERM GOAL #5   Title improve timed up and go to < 13 sec for improved mobility and decreased fall risk (06/17/16)   Status On-going     PT LONG TERM GOAL #6   Title amb > 500' without device on various indoor/outdoor surfaces independently for improved functional mobility (06/17/16)   Status Achieved               Plan - 05/13/16 1047    Clinical Impression Statement Pt now independent with amb on various indoor/outdoor surfaces and safe to amb  without device.  Initiated HEP today for strengthening and pt tolerated well.  Will continue to benefit from PT to maximize function.   PT Treatment/Interventions ADLs/Self Care Home Management;Canalith Repostioning;Neuromuscular re-education;Balance training;Therapeutic exercise;Therapeutic activities;Functional mobility training;Stair training;Gait training;Patient/family education;Vestibular;DME Instruction   PT Next Visit Plan review HEP and add corner balance, SOT and vestibular assessment PRN   Consulted and Agree with Plan of Care Patient      Patient will benefit from skilled therapeutic intervention in order to improve the following deficits and impairments:  Abnormal gait, Decreased balance, Decreased mobility, Difficulty walking, Dizziness, Decreased strength  Visit Diagnosis: Unsteadiness on feet  Other abnormalities of gait and mobility  Dizziness and giddiness     Problem List Patient Active Problem List   Diagnosis Date Noted  . Need for pneumococcal vaccination 04/30/2016  . Concussion with loss of consciousness 04/15/2016  . Migraine 09/12/2015  . Fever blister 09/12/2015  . BMI 28.0-28.9,adult 09/12/2015  . Peripheral neuropathy ([place]) 09/12/2015  . Osteopenia 09/12/2015  . Varicose veins 09/12/2015  . 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 Hyperlipidemia 09/12/2015  . Hypothyroid 12/19/2011  . Dyspnea 01/02/2011      Laureen Abrahams, PT, DPT 05/13/16 10:54 AM    [facility] [address] [place], [place], [address] Phone: [phone]   Fax:  [phone]  Name: Kelsey Warner MRN: [id number] Date of Birth: [birth year]

## 2016-05-13 NOTE — Procedures (Signed)
   [facility]  EEG (ELECTROENCEPHALOGRAM) REPORT   STUDY DATE: 05/02/16 PATIENT NAME: Kelsey Warner DOB: [birth year] MRN: [id number]  ORDERING CLINICIAN: Andrey Spearman, MD   TECHNOLOGIST: Oneita Jolly  TECHNIQUE: Electroencephalogram was recorded utilizing standard 10-20 system of lead placement and reformatted into average and bipolar montages.  RECORDING TIME: 21 minutes  ACTIVATION: photic stimulation  CLINICAL INFORMATION: 76 year old female with loss of consciousness  FINDINGS: Background rhythms of 9-10 hertz and 60-70 microvolts. No focal, lateralizing, epileptiform activity or seizures are seen. Patient recorded in the awake and drowsy state. EKG channel shows regular rhythm of 60-70 beats per minute.  IMPRESSION:  Normal EEG in the awake and drowsy states.    INTERPRETING PHYSICIAN:  Penni Bombard, MD Certified in Neurology, Neurophysiology and Neuroimaging  [facility] [address], [place], [place] [address] [phone]

## 2016-05-14 ENCOUNTER — Encounter: Payer: Self-pay | Admitting: Physician Assistant

## 2016-05-15 ENCOUNTER — Ambulatory Visit: Payer: Medicare Other | Admitting: Physical Therapy

## 2016-05-15 DIAGNOSIS — R2689 Other abnormalities of gait and mobility: Secondary | ICD-10-CM | POA: Diagnosis not present

## 2016-05-15 DIAGNOSIS — R2681 Unsteadiness on feet: Secondary | ICD-10-CM | POA: Diagnosis not present

## 2016-05-15 DIAGNOSIS — R42 Dizziness and giddiness: Secondary | ICD-10-CM | POA: Diagnosis not present

## 2016-05-15 NOTE — Patient Instructions (Signed)
Gaze Stabilization: Standing Feet Apart    Feet shoulder width apart, keeping eyes on target on wall __5-6__ feet away, tilt head down 15-30 and move head side to side for __30__ seconds. Repeat while moving head up and down for __30__ seconds. Do _2-3___ sessions per day. Perform with target on busy background (wrapping paper).  Copyright  VHI. All rights reserved.    Gaze Stabilization: Tip Card  1.Target must remain in focus, not blurry, and appear stationary while head is in motion. 2.Perform exercises with small head movements (45 to either side of midline). 3.Increase speed of head motion so long as target is in focus. 4.If you wear eyeglasses, be sure you can see target through lens (therapist will give specific instructions for bifocal / progressive lenses). 5.These exercises may provoke dizziness or nausea. Work through these symptoms. If too dizzy, slow head movement slightly. Rest between each exercise. 6.Exercises demand concentration; avoid distractions. 7.For safety, perform standing exercises close to a counter, wall, corner, or next to someone.  Copyright  VHI. All rights reserved.

## 2016-05-15 NOTE — Therapy (Signed)
[facility] [address] [place], [place], [address] Phone: [phone]   Fax:  [phone]  Physical Therapy Treatment  Patient Details  Name: Kelsey Warner MRN: [id number] Date of Birth: [birth year] Referring Provider: Dr. Leta Baptist  Encounter Date: 05/15/2016      PT End of Session - 05/15/16 1056    Visit Number 3   Number of Visits 12   Date for PT Re-Evaluation 06/17/16   Authorization Type Medicare- G Codes and Progress Notes   PT Start Time [id number]   PT Stop Time 1054   PT Time Calculation (min) 39 min   Activity Tolerance Patient tolerated treatment well   Behavior During Therapy [facility] for tasks assessed/performed      Past Medical History:  Diagnosis Date  . Elevated LFTs   . Fatty liver disease, nonalcoholic   . Fibromyalgia   . Hyperlipidemia   . Migraine   . Neuromuscular disorder ([place])   . Osteopenia   . Thyroid disease    hypothyroid  . Varicose veins     Past Surgical History:  Procedure Laterality Date  . APPENDECTOMY    . BREAST SURGERY     biopsy  . FOREIGN BODY REMOVAL Right 04/09/2016   Procedure: REMOVAL FOREIGN BODY RIGHT FACE;  Surgeon: Leta Baptist, MD;  Location: [facility];  Service: ENT;  Laterality: Right;   removal foreign body from face   . ganglian cyst Right    right hand  . ROTATOR CUFF REPAIR Left   . TONSILLECTOMY    . TUBAL LIGATION      There were no vitals filed for this visit.      Subjective Assessment - 05/15/16 1019    Subjective doing well, had a hard time trying to find a place to do exercises   Pertinent History fibromyalgia   Limitations Walking   Patient Stated Goals improve balance, walk without cane (not using at home)   Currently in Pain? No/denies                         [facility] Adult PT Treatment/Exercise - 05/15/16 1021      Knee/Hip Exercises: Standing   Heel Raises Both;15 reps   Hip Flexion Both;15 reps;Knee  straight   Hip Flexion Limitations green theraband   Hip ADduction Both;15 reps   Hip ADduction Limitations green theraband   Hip Abduction Both;15 reps;Knee straight   Abduction Limitations green theraband   Hip Extension Both;15 reps;Knee straight   Extension Limitations green theraband   Lateral Step Up Both;10 reps;Hand Hold: 0;Step Height: 6"   Forward Step Up Both;10 reps;Hand Hold: 0;Step Height: 6"         Vestibular Treatment/Exercise - 05/15/16 1055      Vestibular Treatment/Exercise   Vestibular Treatment Provided Gaze   Gaze Exercises X1 Viewing Horizontal;X1 Viewing Vertical     X1 Viewing Horizontal   Foot Position feet apart   Time --  30 sec   Reps 2   Comments with and without full field stimulus     X1 Viewing Vertical   Foot Position feet apart   Time --  30 sec   Reps 2   Comments with and without full field stimulus            Balance Exercises - 05/15/16 1054      Balance Exercises: Standing   Balance Beam blue foam: horizontal and vertical head turns  x 10           PT Education - 05/15/16 1056    Education provided Yes   Education Details standing gaze   Person(s) Educated Patient   Methods Explanation;Demonstration;Handout   Comprehension Verbalized understanding;Returned demonstration;Need further instruction             PT Long Term Goals - 05/13/16 1047      PT LONG TERM GOAL #1   Title independent with HEP (06/17/16)   Status On-going     PT LONG TERM GOAL #2   Title verbalize understanding of CVA risk factors/warning signs (06/17/16)   Status On-going     PT LONG TERM GOAL #3   Title improve FGA to >/= 18/30 for improved balance and mobility (06/17/16)   Status On-going     PT LONG TERM GOAL #4   Title improve gait velocity to > 2.62 ft/sec with LRAD for improved community access (06/17/16)   Status On-going     PT LONG TERM GOAL #5   Title improve timed up and go to < 13 sec for improved mobility and decreased  fall risk (06/17/16)   Status On-going     PT LONG TERM GOAL #6   Title amb > 500' without device on various indoor/outdoor surfaces independently for improved functional mobility (06/17/16)   Status Achieved               Plan - 05/15/16 1056    Clinical Impression Statement Pt reports difficulty maintaining gaze with tablet or computer work so initiated gaze today.  Full field stimulus increased symptoms with horizontal movements.  Will continue to benefit from PT to maximize function.   PT Treatment/Interventions ADLs/Self Care Home Management;Canalith Repostioning;Neuromuscular re-education;Balance training;Therapeutic exercise;Therapeutic activities;Functional mobility training;Stair training;Gait training;Patient/family education;Vestibular;DME Instruction   PT Next Visit Plan review gaze, add corner balance, SOT and vestibular assessment PRN, strengthening and balance exercises      Patient will benefit from skilled therapeutic intervention in order to improve the following deficits and impairments:  Abnormal gait, Decreased balance, Decreased mobility, Difficulty walking, Dizziness, Decreased strength  Visit Diagnosis: Unsteadiness on feet  Other abnormalities of gait and mobility  Dizziness and giddiness     Problem List Patient Active Problem List   Diagnosis Date Noted  . Need for pneumococcal vaccination 04/30/2016  . Concussion with loss of consciousness 04/15/2016  . Migraine 09/12/2015  . Fever blister 09/12/2015  . BMI 28.0-28.9,adult 09/12/2015  . Peripheral neuropathy ([place]) 09/12/2015  . Osteopenia 09/12/2015  . Varicose veins 09/12/2015  . 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 Hyperlipidemia 09/12/2015  . Hypothyroid 12/19/2011  . Dyspnea 01/02/2011      Laureen Abrahams, PT, DPT 05/15/16 10:58 AM   [facility] [address] [place], [place], [address] Phone: [phone]   Fax:  [phone]  Name: Kelsey Warner MRN: [id number] Date of Birth: [birth year]

## 2016-05-16 ENCOUNTER — Other Ambulatory Visit: Payer: Medicare Other

## 2016-05-16 ENCOUNTER — Other Ambulatory Visit: Payer: Self-pay | Admitting: Physician Assistant

## 2016-05-16 DIAGNOSIS — R7989 Other specified abnormal findings of blood chemistry: Secondary | ICD-10-CM

## 2016-05-16 DIAGNOSIS — E038 Other specified hypothyroidism: Secondary | ICD-10-CM | POA: Diagnosis not present

## 2016-05-16 NOTE — Addendum Note (Signed)
Addended by: Gari Crown D on: 05/16/2016 09:20 AM   Modules accepted: Orders

## 2016-05-17 LAB — T3, FREE: T3 FREE: 2.4 pg/mL (ref 2.0–4.4)

## 2016-05-17 LAB — T4, FREE: Free T4: 1.4 ng/dL (ref 0.82–1.77)

## 2016-05-20 ENCOUNTER — Encounter: Payer: Self-pay | Admitting: Physical Therapy

## 2016-05-20 ENCOUNTER — Ambulatory Visit: Payer: Medicare Other | Attending: Diagnostic Neuroimaging | Admitting: Physical Therapy

## 2016-05-20 DIAGNOSIS — R42 Dizziness and giddiness: Secondary | ICD-10-CM | POA: Insufficient documentation

## 2016-05-20 DIAGNOSIS — R2681 Unsteadiness on feet: Secondary | ICD-10-CM

## 2016-05-20 DIAGNOSIS — R2689 Other abnormalities of gait and mobility: Secondary | ICD-10-CM | POA: Diagnosis not present

## 2016-05-20 NOTE — Therapy (Signed)
[facility] [address] [place], [place], [address] Phone: [phone]   Fax:  [phone]  Physical Therapy Treatment  Patient Details  Name: Kelsey Warner MRN: [id number] Date of Birth: 1940-09-23 Referring Provider: Dr. Leta Warner  Encounter Date: 05/20/2016      PT End of Session - 05/20/16 1306    Visit Number 4   Number of Visits 12   Date for PT Re-Evaluation 06/17/16   Authorization Type Medicare- G Codes and Progress Notes   PT Start Time 1103   PT Stop Time 1147   PT Time Calculation (min) 44 min   Activity Tolerance Patient tolerated treatment well   Behavior During Therapy [facility] for tasks assessed/performed      Past Medical History:  Diagnosis Date  . Elevated LFTs   . Fatty liver disease, nonalcoholic   . Fibromyalgia   . Hyperlipidemia   . Migraine   . Neuromuscular disorder ([place])   . Osteopenia   . Thyroid disease    hypothyroid  . Varicose veins     Past Surgical History:  Procedure Laterality Date  . APPENDECTOMY    . BREAST SURGERY     biopsy  . FOREIGN BODY REMOVAL Right 04/09/2016   Procedure: REMOVAL FOREIGN BODY RIGHT FACE;  Surgeon: Kelsey Baptist, MD;  Location: [facility];  Service: ENT;  Laterality: Right;   removal foreign body from face   . ganglian cyst Right    right hand  . ROTATOR CUFF REPAIR Left   . TONSILLECTOMY    . TUBAL LIGATION      There were no vitals filed for this visit.      Subjective Assessment - 05/20/16 1108    Subjective Pt reports she is doing really well, no issues with the exercises-she feels she is getting back to normal and does not see why she would need to continue with therapy.   Pertinent History fibromyalgia   Limitations Walking   Patient Stated Goals improve balance, walk without cane (not using at home)   Currently in Pain? No/denies          [facility] Adult PT Treatment/Exercise - 05/20/16 1109      Knee/Hip Exercises:  Aerobic   Tread Mill 2.0 mph x 5 min with UE support bilat and then for 3 min with one UE support at 1.5 mph          Balance Exercises - 05/20/16 1139      Balance Exercises: Standing   Retro Gait 4 reps;Theraband   Sidestepping 4 reps;Theraband   Marching Limitations x 4 reps high knee marching with one repetition performing dual cognitive/subtraction task            PT Education - 05/20/16 1305    Education provided Yes   Education Details CVA prevention and aerobic/cardiovascular health   Person(s) Educated Patient   Methods Explanation   Comprehension Verbalized understanding             PT Long Term Goals - 05/13/16 1047      PT LONG TERM GOAL #1   Title independent with HEP (06/17/16)   Status On-going     PT LONG TERM GOAL #2   Title verbalize understanding of CVA risk factors/warning signs (06/17/16)   Status On-going     PT LONG TERM GOAL #3   Title improve FGA to >/= 18/30 for improved balance and mobility (06/17/16)   Status On-going  PT LONG TERM GOAL #4   Title improve gait velocity to > 2.62 ft/sec with LRAD for improved community access (06/17/16)   Status On-going     PT LONG TERM GOAL #5   Title improve timed up and go to < 13 sec for improved mobility and decreased fall risk (06/17/16)   Status On-going     PT LONG TERM GOAL #6   Title amb > 500' without device on various indoor/outdoor surfaces independently for improved functional mobility (06/17/16)   Status Achieved               Plan - 05/20/16 1306    Clinical Impression Statement Due to pt reporting improvement in symptoms overall incorporated higher level gait, balance and dual task activities.  Also educated pt on importance of aerobic activity/conditioning for overall health/wellness, CVA prevention and ideas for aerobic exercise at home and in community.  Pt reports she has performed a walking program in the past and would feel comfortable starting that again.  Will continue to  address.   Rehab Potential Good   Clinical Impairments Affecting Rehab Potential Confirmed CVA   PT Treatment/Interventions ADLs/Self Care Home Management;Canalith Repostioning;Neuromuscular re-education;Balance training;Therapeutic exercise;Therapeutic activities;Functional mobility training;Stair training;Gait training;Patient/family education;Vestibular;DME Instruction   PT Next Visit Plan review gaze, add corner balance, aerobic conditioning and dual task gait and balance exercises   Consulted and Agree with Plan of Care Patient      Patient will benefit from skilled therapeutic intervention in order to improve the following deficits and impairments:  Abnormal gait, Decreased balance, Decreased mobility, Difficulty walking, Dizziness, Decreased strength  Visit Diagnosis: Unsteadiness on feet  Other abnormalities of gait and mobility  Dizziness and giddiness     Problem List Patient Active Problem List   Diagnosis Date Noted  . Need for pneumococcal vaccination 04/30/2016  . Concussion with loss of consciousness 04/15/2016  . Migraine 09/12/2015  . Fever blister 09/12/2015  . BMI 28.0-28.9,adult 09/12/2015  . Peripheral neuropathy ([place]) 09/12/2015  . Osteopenia 09/12/2015  . Varicose veins 09/12/2015  . 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 Hyperlipidemia 09/12/2015  . Hypothyroid 12/19/2011  . Dyspnea 01/02/2011   Kelsey Warner, PT, DPT 05/20/16    1:12 PM   [facility] [address] [place], [place], [address] Phone: [phone]   Fax:  [phone]  Name: Kelsey Warner MRN: [id number] Date of Birth: 04/24/1940

## 2016-05-22 ENCOUNTER — Ambulatory Visit: Payer: Medicare Other | Admitting: Physical Therapy

## 2016-05-27 ENCOUNTER — Ambulatory Visit: Payer: Medicare Other | Admitting: Physical Therapy

## 2016-05-29 ENCOUNTER — Ambulatory Visit: Payer: Medicare Other | Admitting: Physical Therapy

## 2016-05-29 ENCOUNTER — Encounter: Payer: Self-pay | Admitting: Physical Therapy

## 2016-05-29 DIAGNOSIS — R42 Dizziness and giddiness: Secondary | ICD-10-CM

## 2016-05-29 DIAGNOSIS — R2689 Other abnormalities of gait and mobility: Secondary | ICD-10-CM | POA: Diagnosis not present

## 2016-05-29 DIAGNOSIS — R2681 Unsteadiness on feet: Secondary | ICD-10-CM

## 2016-05-29 NOTE — Therapy (Signed)
[facility] [address] [place], [place], [address] Phone: [phone]   Fax:  [phone]  Physical Therapy Treatment  Patient Details  Name: Kelsey Warner MRN: [id number] Date of Birth: 1940-10-31 Referring Provider: Dr. Leta Baptist  Encounter Date: 05/29/2016      PT End of Session - 05/29/16 1057    Visit Number 5   Number of Visits 12  D/C today   Date for PT Re-Evaluation 06/17/16   Authorization Type Medicare- G Codes and Progress Notes   PT Start Time 1019   PT Stop Time 1056   PT Time Calculation (min) 37 min   Activity Tolerance Patient tolerated treatment well   Behavior During Therapy [facility] for tasks assessed/performed      Past Medical History:  Diagnosis Date  . Elevated LFTs   . Fatty liver disease, nonalcoholic   . Fibromyalgia   . Hyperlipidemia   . Migraine   . Neuromuscular disorder ([place])   . Osteopenia   . Thyroid disease    hypothyroid  . Varicose veins     Past Surgical History:  Procedure Laterality Date  . APPENDECTOMY    . BREAST SURGERY     biopsy  . FOREIGN BODY REMOVAL Right 04/09/2016   Procedure: REMOVAL FOREIGN BODY RIGHT FACE;  Surgeon: Leta Baptist, MD;  Location: [facility];  Service: ENT;  Laterality: Right;   removal foreign body from face   . ganglian cyst Right    right hand  . ROTATOR CUFF REPAIR Left   . TONSILLECTOMY    . TUBAL LIGATION      There were no vitals filed for this visit.      Subjective Assessment - 05/29/16 1023    Subjective Pt reports that she is continuing to do well and is ready "to stop coming over here to see you guys!".  Pt reports walking further and not having any fatigue afterwards.   Pertinent History fibromyalgia   Limitations Walking   Patient Stated Goals improve balance, walk without cane (not using at home)   Currently in Pain? No/denies            [facility] Assessment - 05/29/16 1028       Observation/Other Assessments   Focus on Therapeutic Outcomes (FOTO)  85 (15% limited)   Neuro Quality of Life  Lower Extremity: 50.3     Standardized Balance Assessment   Standardized Balance Assessment Timed Up and Go Test;10 meter walk test   10 Meter Walk 6.65 seconds or 4.93 ft/sec     Timed Up and Go Test   TUG Normal TUG   Normal TUG (seconds) 7.87   TUG Comments without AD     Functional Gait  Assessment   Gait assessed  Yes   Gait Level Surface Walks 20 ft in less than 5.5 sec, no assistive devices, good speed, no evidence for imbalance, normal gait pattern, deviates no more than 6 in outside of the 12 in walkway width.   Change in Gait Speed Able to smoothly change walking speed without loss of balance or gait deviation. Deviate no more than 6 in outside of the 12 in walkway width.   Gait with Horizontal Head Turns Performs head turns smoothly with slight change in gait velocity (eg, minor disruption to smooth gait path), deviates 6-10 in outside 12 in walkway width, or uses an assistive device.   Gait with Vertical Head Turns Performs head turns with no change  in gait. Deviates no more than 6 in outside 12 in walkway width.   Gait and Pivot Turn Pivot turns safely within 3 sec and stops quickly with no loss of balance.   Step Over Obstacle Is able to step over 2 stacked shoe boxes taped together (9 in total height) without changing gait speed. No evidence of imbalance.   Gait with Narrow Base of Support Is able to ambulate for 10 steps heel to toe with no staggering.   Gait with Eyes Closed Walks 20 ft, uses assistive device, slower speed, mild gait deviations, deviates 6-10 in outside 12 in walkway width. Ambulates 20 ft in less than 9 sec but greater than 7 sec.   Ambulating Backwards Walks 20 ft, no assistive devices, good speed, no evidence for imbalance, normal gait   Steps Alternating feet, no rail.   Total Score 28   FGA comment: 28/30 low falls risk            PT  Education - 05/29/16 1637    Education provided Yes   Education Details signs/symptoms of CVA, CVA prevention   Person(s) Educated Patient   Methods Explanation   Comprehension Verbalized understanding             PT Long Term Goals - 05/29/16 1040      PT LONG TERM GOAL #1   Title independent with HEP (06/17/16)   Status Achieved     PT LONG TERM GOAL #2   Title verbalize understanding of CVA risk factors/warning signs (06/17/16)   Status Achieved     PT LONG TERM GOAL #3   Title improve FGA to >/= 18/30 for improved balance and mobility (06/17/16)   Baseline MET: 28/30   Status Achieved     PT LONG TERM GOAL #4   Title improve gait velocity to > 2.62 ft/sec with LRAD for improved community access (06/17/16)   Baseline MET 4.43f/sec   Status Achieved     PT LONG TERM GOAL #5   Title improve timed up and go to < 13 sec for improved mobility and decreased fall risk (06/17/16)   Baseline MET 7 seconds   Status Achieved     PT LONG TERM GOAL #6   Title amb > 500' without device on various indoor/outdoor surfaces independently for improved functional mobility (06/17/16)   Baseline MET independent >1,000   Status Achieved               Plan - 05/29/16 1637    Clinical Impression Statement Treatment session with focus on re-assessment of all LTG.  Pt has made excellent progress; pt has met all LTG and has returned to baseline function.  Pt able to verbalize signs and symptoms of CVA and prevention of CVA.  Pt has initiated walking program and plans to continue this with HEP.  Pt to D/C today.   Rehab Potential Good   Clinical Impairments Affecting Rehab Potential Confirmed CVA   PT Treatment/Interventions ADLs/Self Care Home Management;Canalith Repostioning;Neuromuscular re-education;Balance training;Therapeutic exercise;Therapeutic activities;Functional mobility training;Stair training;Gait training;Patient/family education;Vestibular;DME Instruction   PT Next Visit Plan D/C  today   Consulted and Agree with Plan of Care Patient      Patient will benefit from skilled therapeutic intervention in order to improve the following deficits and impairments:  Abnormal gait, Decreased balance, Decreased mobility, Difficulty walking, Dizziness, Decreased strength  Visit Diagnosis: Unsteadiness on feet  Other abnormalities of gait and mobility  Dizziness and giddiness  G-Codes - 05/29/16 1639    Functional Assessment Tool Used (Outpatient Only) FGA, gait velocity, TUG   Functional Limitation Mobility: Walking and moving around   Mobility: Walking and Moving Around Goal Status [phone]) At least 20 percent but less than 40 percent impaired, limited or restricted   Mobility: Walking and Moving Around Discharge Status [phone]) At least 1 percent but less than 20 percent impaired, limited or restricted      Problem List Patient Active Problem List   Diagnosis Date Noted  . Need for pneumococcal vaccination 04/30/2016  . Concussion with loss of consciousness 04/15/2016  . Migraine 09/12/2015  . Fever blister 09/12/2015  . BMI 28.0-28.9,adult 09/12/2015  . Peripheral neuropathy ([place]) 09/12/2015  . Osteopenia 09/12/2015  . Varicose veins 09/12/2015  . 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 Hyperlipidemia 09/12/2015  . Hypothyroid 12/19/2011  . Dyspnea 01/02/2011   PHYSICAL THERAPY DISCHARGE SUMMARY  Visits from Start of Care: 5  Current functional level related to goals / functional outcomes: See goals above   Remaining deficits: Mild higher level gait/balance deficits   Education / Equipment: HEP  Plan: Patient agrees to discharge.  Patient goals were met. Patient is being discharged due to meeting the stated rehab goals.  ?????      [name], PT, DPT 05/29/16    4:45 PM    [facility] [address] [place], [place], [address] Phone: [phone]   Fax:  [phone]  Name: SELIN EISLER MRN: [id number] Date of Birth: 06/25/1940

## 2016-05-29 NOTE — Patient Instructions (Signed)
Ischemic Stroke An ischemic stroke is the sudden death of brain tissue. Blood carries oxygen to all areas of the body. This type of stroke happens when your blood does not flow to your brain like normal. Your brain cannot get the oxygen it needs. This is an emergency. It must be treated right away. Symptoms of a stroke usually happen all of a sudden. You may notice them when you wake up. They can include:  Weakness or loss of feeling in your face, arm, or leg. This often happens on one side of the body.  Trouble walking.  Trouble moving your arms or legs.  Loss of balance or coordination.  Feeling confused.  Trouble talking or understanding what people are saying.  Slurred speech.  Trouble seeing.  Seeing two of one object (double vision).  Feeling dizzy.  Feeling sick to your stomach (nauseous) and throwing up (vomiting).  A very bad headache for no reason. Get help as soon as any of these problems start. This is important. Some treatments work better if they are given right away. These include:  Aspirin.  Medicines to control blood pressure.  A shot (injection) of medicine to break up the blood clot.  Treatments given in the blood vessel (artery) to take out the clot or break it up. Other treatments may include:  Oxygen.  Fluids given through an IV tube.  Medicines to thin out your blood.  Procedures to help your blood flow better. What increases the risk? Certain things may make you more likely to have a stroke. Some of these are things that you can change, such as:  Being very overweight (obesity).  Smoking.  Taking birth control pills.  Not being active.  Drinking too much alcohol.  Using drugs. Other risk factors include:  High blood pressure.  High cholesterol.  Diabetes.  Heart disease.  Being African American, Native American, Hispanic, or [place] Native.  Being over age 60.  Family history of stroke.  Having had blood clots, stroke,  or warning stroke (transient ischemic attack, TIA) in the past.  Sickle cell disease.  Being a woman with a history of high blood pressure in pregnancy (preeclampsia).  Migraine headache.  Sleep apnea.  Having an irregular heartbeat (atrial fibrillation).  Long-term (chronic) diseases that cause soreness and swelling (inflammation).  Disorders that affect how your blood clots. Follow these instructions at home: Medicines  Take over-the-counter and prescription medicines only as told by your doctor.  If you were told to take aspirin or another medicine to thin your blood, take it exactly as told by your doctor.  Taking too much of the medicine can cause bleeding.  If you do not take enough, it may not work as well.  Know the side effects of your medicines. If you are taking a blood thinner, make sure you:  Hold pressure over any cuts for longer than usual.  Tell your dentist and other doctors that you take this medicine.  Avoid activities that may cause damage or injury to your body. Eating and drinking  Follow instructions from your doctor about what you cannot eat or drink.  Eat healthy foods.  If you have trouble with swallowing, do these things to avoid choking:  Take small bites when eating.  Eat foods that are soft or pureed. Safety  Follow instructions from your health care team about physical activity.  Use a walker or cane as told by your doctor.  Keep your home safe so you do not fall. This   may include:  Having experts look at your home to make sure it is safe.  Putting grab bars in the bedroom and bathroom.  Using raised toilets.  Putting a seat in the shower. General instructions  Do not use any tobacco products.  Examples of these are cigarettes, chewing tobacco, and e-cigarettes.  If you need help quitting, ask your doctor.  Limit how much alcohol you drink. This means no more than 1 drink a day for nonpregnant women and 2 drinks a day  for men. One drink equals 12 oz of beer, 5 oz of wine, or 1 oz of hard liquor.  If you need help to stop using drugs or alcohol, ask your doctor to refer you to a program or specialist.  Stay active. Exercise as told by your doctor.  Keep all follow-up visits as told by your doctor. This is important. Get help right away if:  You suddenly:  Have weakness or loss of feeling in your face, arm, or leg.  Feel confused.  Have trouble talking or understanding what people are saying.  Have trouble seeing.  Have trouble walking.  Have trouble moving your arms or legs.  Feel dizzy.  Lose your balance or coordination.  Have a very bad headache and you do not know why.  You pass out (lose consciousness) or almost pass out.  You have jerky movements that you cannot control (seizure). These symptoms may be an emergency. Do not wait to see if the symptoms will go away. Get medical help right away. Call your local emergency services (911 in the [place] Do not drive yourself to the hospital.  This information is not intended to replace advice given to you by your health care provider. Make sure you discuss any questions you have with your health care provider. Document Released: 02/21/2011 Document Revised: 08/15/2015 Document Reviewed: 05/31/2015 [employer] Interactive Patient Education  2017 [employer]  

## 2016-06-03 ENCOUNTER — Encounter: Payer: Medicare Other | Admitting: Physical Therapy

## 2016-06-05 ENCOUNTER — Encounter: Payer: Medicare Other | Admitting: Physical Therapy

## 2016-06-06 ENCOUNTER — Encounter: Payer: Federal, State, Local not specified - PPO | Admitting: Diagnostic Neuroimaging

## 2016-06-10 ENCOUNTER — Encounter: Payer: Self-pay | Admitting: Physician Assistant

## 2016-06-12 DIAGNOSIS — S0121XD Laceration without foreign body of nose, subsequent encounter: Secondary | ICD-10-CM | POA: Diagnosis not present

## 2016-06-20 ENCOUNTER — Ambulatory Visit: Payer: Medicare Other | Admitting: Neurology

## 2016-09-24 ENCOUNTER — Ambulatory Visit (INDEPENDENT_AMBULATORY_CARE_PROVIDER_SITE_OTHER): Payer: Medicare Other | Admitting: Physician Assistant

## 2016-09-24 ENCOUNTER — Encounter: Payer: Self-pay | Admitting: Physician Assistant

## 2016-09-24 VITALS — BP 135/69 | HR 89 | Temp 97.9°F | Resp 18 | Ht 67.5 in | Wt 172.2 lb

## 2016-09-24 DIAGNOSIS — G500 Trigeminal neuralgia: Secondary | ICD-10-CM | POA: Diagnosis not present

## 2016-09-24 DIAGNOSIS — M8580 Other specified disorders of bone density and structure, unspecified site: Secondary | ICD-10-CM | POA: Diagnosis not present

## 2016-09-24 DIAGNOSIS — Z23 Encounter for immunization: Secondary | ICD-10-CM

## 2016-09-24 DIAGNOSIS — E038 Other specified hypothyroidism: Secondary | ICD-10-CM

## 2016-09-24 DIAGNOSIS — R739 Hyperglycemia, unspecified: Secondary | ICD-10-CM | POA: Diagnosis not present

## 2016-09-24 DIAGNOSIS — E785 Hyperlipidemia, unspecified: Secondary | ICD-10-CM

## 2016-09-24 DIAGNOSIS — Z0000 Encounter for general adult medical examination without abnormal findings: Secondary | ICD-10-CM | POA: Diagnosis not present
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
## 2016-09-24 NOTE — Patient Instructions (Addendum)
Bring me your Advance Directives. I'll pull your paper record and look for the Pneumovax 23 vaccine.    IF you received an x-ray today, you will receive an invoice from [facility]. Please contact [facility] at [phone] with questions or concerns regarding your invoice.   IF you received labwork today, you will receive an invoice from [facility]. Please contact [facility] at [phone] with questions or concerns regarding your invoice.   Our billing staff will not be able to assist you with questions regarding bills from these companies.  You will be contacted with the lab results as soon as they are available. The fastest way to get your results is to activate your My Chart account. Instructions are located on the last page of this paperwork. If you have not heard from [place] regarding the results in 2 weeks, please contact this office.

## 2016-09-24 NOTE — Assessment & Plan Note (Signed)
Intolerant to calcium. Continue vitamin D. Recheck in 09/2017.

## 2016-09-25 LAB — TSH: TSH: 0.232 u[IU]/mL — ABNORMAL LOW (ref 0.450–4.500)

## 2016-09-25 LAB — LIPID PANEL
CHOLESTEROL TOTAL: 277 mg/dL — AB (ref 100–199)
Chol/HDL Ratio: 5.2 ratio — ABNORMAL HIGH (ref 0.0–4.4)
HDL: 53 mg/dL (ref >39)
LDL CALC: 159 mg/dL — AB (ref 0–99)
TRIGLYCERIDES: 326 mg/dL — AB (ref 0–149)
VLDL CHOLESTEROL CAL: 65 mg/dL — AB (ref 5–40)

## 2016-09-25 LAB — COMPREHENSIVE METABOLIC PANEL
ALT: 23 IU/L (ref 0–32)
AST: 22 IU/L (ref 0–40)
Albumin/Globulin Ratio: 1.6 (ref 1.2–2.2)
Albumin: 4.5 g/dL (ref 3.5–4.8)
Alkaline Phosphatase: 87 IU/L (ref 39–117)
BUN/Creatinine Ratio: 18 (ref 12–28)
BUN: 14 mg/dL (ref 8–27)
Bilirubin Total: <0.2 mg/dL (ref 0.0–1.2)
CALCIUM: 9.6 mg/dL (ref 8.7–10.3)
CO2: 28 mmol/L (ref 20–29)
Chloride: 99 mmol/L (ref 96–106)
Creatinine, Ser: 0.78 mg/dL (ref 0.57–1.00)
GFR, EST AFRICAN AMERICAN: 87 mL/min/{1.73_m2} (ref >59)
GFR, EST NON AFRICAN AMERICAN: 76 mL/min/{1.73_m2} (ref >59)
GLUCOSE: 109 mg/dL — AB (ref 65–99)
Globulin, Total: 2.9 g/dL (ref 1.5–4.5)
Potassium: 4.2 mmol/L (ref 3.5–5.2)
Sodium: 140 mmol/L (ref 134–144)
TOTAL PROTEIN: 7.4 g/dL (ref 6.0–8.5)

## 2016-09-25 LAB — HEMOGLOBIN A1C
Est. average glucose Bld gHb Est-mCnc: 131 mg/dL
Hgb A1c MFr Bld: 6.2 % — ABNORMAL HIGH (ref 4.8–5.6)

## 2016-09-25 LAB — CBC WITH DIFFERENTIAL/PLATELET
BASOS: 0 %
Basophils Absolute: 0 10*3/uL (ref 0.0–0.2)
EOS (ABSOLUTE): 0.2 10*3/uL (ref 0.0–0.4)
Eos: 2 %
Hematocrit: 37.8 % (ref 34.0–46.6)
Hemoglobin: 12.6 g/dL (ref 11.1–15.9)
IMMATURE GRANS (ABS): 0 10*3/uL (ref 0.0–0.1)
IMMATURE GRANULOCYTES: 0 %
LYMPHS: 33 %
Lymphocytes Absolute: 2 10*3/uL (ref 0.7–3.1)
MCH: 29.8 pg (ref 26.6–33.0)
MCHC: 33.3 g/dL (ref 31.5–35.7)
MCV: 89 fL (ref 79–97)
Monocytes Absolute: 0.4 10*3/uL (ref 0.1–0.9)
Monocytes: 7 %
NEUTROS PCT: 58 %
Neutrophils Absolute: 3.6 10*3/uL (ref 1.4–7.0)
PLATELETS: 296 10*3/uL (ref 150–379)
RBC: 4.23 x10E6/uL (ref 3.77–5.28)
RDW: 15.2 % (ref 12.3–15.4)
WBC: 6.2 10*3/uL (ref 3.4–10.8)

## 2016-09-25 LAB — T4, FREE: Free T4: 1.39 ng/dL (ref 0.82–1.77)

## 2016-09-26 ENCOUNTER — Encounter: Payer: Self-pay | Admitting: Physician Assistant
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
## 2016-09-26 NOTE — Assessment & Plan Note (Signed)
Believes that she has had this, but no documentation found. Will review paper record.

## 2016-09-26 NOTE — Assessment & Plan Note (Signed)
Clinically euthyroid. Await labs. Adjust regimen as indicated by results.

## 2016-09-26 NOTE — Assessment & Plan Note (Signed)
Intolerant to multiple statins. Will not try another product. Await labs.

## 2016-09-27 MED ORDER — LEVOTHYROXINE SODIUM 100 MCG PO TABS: 100.0000 ug | ORAL_TABLET | Freq: Every day | ORAL | 3 refills | Status: DC

## 2016-09-27 NOTE — Addendum Note (Signed)
Addended by: Fara Chute on: 09/27/2016 07:53 AM   Modules accepted: Orders

## 2016-11-26 DIAGNOSIS — D229 Melanocytic nevi, unspecified: Secondary | ICD-10-CM | POA: Diagnosis not present

## 2016-11-26 DIAGNOSIS — L720 Epidermal cyst: Secondary | ICD-10-CM | POA: Diagnosis not present

## 2016-11-26 DIAGNOSIS — L821 Other seborrheic keratosis: Secondary | ICD-10-CM | POA: Diagnosis not present

## 2017-01-27 ENCOUNTER — Other Ambulatory Visit: Payer: Self-pay | Admitting: Physician Assistant

## 2017-01-27 DIAGNOSIS — G609 Hereditary and idiopathic neuropathy, unspecified: Secondary | ICD-10-CM

## 2017-01-27 NOTE — Telephone Encounter (Signed)
Please advise 

## 2017-01-27 NOTE — Telephone Encounter (Signed)
Refill of Lyrica 

## 2017-01-28 ENCOUNTER — Telehealth: Payer: Self-pay

## 2017-01-28 NOTE — Telephone Encounter (Signed)
Meds ordered this encounter  Medications  . LYRICA 50 MG capsule    Sig: TAKE 1 CAPSULE BY MOUTH THREE TIMES DAILY    Dispense:  90 capsule    Refill:  0    Please advise patient that she is due for follow-up with me and Medicare Annual Wellness Visit with Dewitt Hoes, LPN

## 2017-03-05 NOTE — Telephone Encounter (Signed)
Error-Sign Encounter 

## 2017-03-12 ENCOUNTER — Other Ambulatory Visit: Payer: Self-pay | Admitting: Physician Assistant

## 2017-03-12 DIAGNOSIS — G609 Hereditary and idiopathic neuropathy, unspecified: Secondary | ICD-10-CM

## 2017-03-12 NOTE — Telephone Encounter (Signed)
The system would not allow me to refill this.

## 2017-03-28 ENCOUNTER — Encounter: Payer: Self-pay | Admitting: Physician Assistant

## 2017-04-10 ENCOUNTER — Encounter: Payer: Self-pay | Admitting: Physician Assistant

## 2017-04-11 ENCOUNTER — Ambulatory Visit (INDEPENDENT_AMBULATORY_CARE_PROVIDER_SITE_OTHER): Payer: Medicare Other

## 2017-04-11 ENCOUNTER — Other Ambulatory Visit: Payer: Self-pay

## 2017-04-11 ENCOUNTER — Encounter: Payer: Self-pay | Admitting: Physician Assistant

## 2017-04-11 ENCOUNTER — Ambulatory Visit (INDEPENDENT_AMBULATORY_CARE_PROVIDER_SITE_OTHER): Payer: Medicare Other | Admitting: Physician Assistant

## 2017-04-11 ENCOUNTER — Other Ambulatory Visit: Payer: Self-pay | Admitting: Physician Assistant

## 2017-04-11 VITALS — BP 130/60 | HR 77 | Temp 98.0°F | Resp 18 | Ht 67.5 in | Wt 177.2 lb

## 2017-04-11 DIAGNOSIS — E038 Other specified hypothyroidism: Secondary | ICD-10-CM

## 2017-04-11 DIAGNOSIS — M546 Pain in thoracic spine: Secondary | ICD-10-CM | POA: Diagnosis not present

## 2017-04-11 DIAGNOSIS — Z23 Encounter for immunization: Secondary | ICD-10-CM | POA: Diagnosis not present

## 2017-04-11 DIAGNOSIS — M549 Dorsalgia, unspecified: Secondary | ICD-10-CM

## 2017-04-11 DIAGNOSIS — R7303 Prediabetes: Secondary | ICD-10-CM

## 2017-04-11 DIAGNOSIS — E785 Hyperlipidemia, unspecified: Secondary | ICD-10-CM

## 2017-04-11 DIAGNOSIS — M4714 Other spondylosis with myelopathy, thoracic region: Secondary | ICD-10-CM | POA: Insufficient documentation

## 2017-04-11 LAB — POCT URINALYSIS DIP (MANUAL ENTRY)
BILIRUBIN UA: NEGATIVE
BILIRUBIN UA: NEGATIVE mg/dL
GLUCOSE UA: NEGATIVE mg/dL
Nitrite, UA: NEGATIVE
PROTEIN UA: NEGATIVE mg/dL
Urobilinogen, UA: 0.2 E.U./dL
pH, UA: 5.5 (ref 5.0–8.0)

## 2017-04-11 MED ORDER — TRAMADOL HCL 50 MG PO TABS: 50.0000 mg | ORAL_TABLET | Freq: Three times a day (TID) | ORAL | 0 refills | Status: DC | PRN

## 2017-04-11 MED ORDER — CARISOPRODOL 350 MG PO TABS: 350.0000 mg | ORAL_TABLET | Freq: Every day | ORAL | 0 refills | Status: DC
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
## 2017-04-11 NOTE — Patient Instructions (Addendum)
Check with your insurance plan to find out if Repatha is covered.    IF you received an x-ray today, you will receive an invoice from [facility]. Please contact [facility] at [phone] with questions or concerns regarding your invoice.   IF you received labwork today, you will receive an invoice from [place]. Please contact [facility] at [phone] with questions or concerns regarding your invoice.   Our billing staff will not be able to assist you with questions regarding bills from these companies.  You will be contacted with the lab results as soon as they are available. The fastest way to get your results is to activate your My Chart account. Instructions are located on the last page of this paperwork. If you have not heard from [place] regarding the results in 2 weeks, please contact this office.

## 2017-04-12 LAB — COMPREHENSIVE METABOLIC PANEL
A/G RATIO: 1.6 (ref 1.2–2.2)
ALK PHOS: 77 IU/L (ref 39–117)
ALT: 28 IU/L (ref 0–32)
AST: 25 IU/L (ref 0–40)
Albumin: 4.6 g/dL (ref 3.5–4.8)
BUN/Creatinine Ratio: 19 (ref 12–28)
BUN: 12 mg/dL (ref 8–27)
Bilirubin Total: <0.2 mg/dL (ref 0.0–1.2)
CHLORIDE: 99 mmol/L (ref 96–106)
CO2: 24 mmol/L (ref 20–29)
Calcium: 9.8 mg/dL (ref 8.7–10.3)
Creatinine, Ser: 0.63 mg/dL (ref 0.57–1.00)
GFR calc Af Amer: 102 mL/min/{1.73_m2} (ref >59)
GFR calc non Af Amer: 89 mL/min/{1.73_m2} (ref >59)
GLOBULIN, TOTAL: 2.9 g/dL (ref 1.5–4.5)
Glucose: 148 mg/dL — ABNORMAL HIGH (ref 65–99)
POTASSIUM: 4.1 mmol/L (ref 3.5–5.2)
SODIUM: 139 mmol/L (ref 134–144)
Total Protein: 7.5 g/dL (ref 6.0–8.5)

## 2017-04-12 LAB — CBC WITH DIFFERENTIAL/PLATELET
Basophils Absolute: 0 10*3/uL (ref 0.0–0.2)
Basos: 1 %
EOS (ABSOLUTE): 0.2 10*3/uL (ref 0.0–0.4)
EOS: 4 %
HEMATOCRIT: 36 % (ref 34.0–46.6)
Hemoglobin: 12.3 g/dL (ref 11.1–15.9)
Immature Grans (Abs): 0 10*3/uL (ref 0.0–0.1)
Immature Granulocytes: 0 %
LYMPHS ABS: 1.9 10*3/uL (ref 0.7–3.1)
Lymphs: 33 %
MCH: 30.4 pg (ref 26.6–33.0)
MCHC: 34.2 g/dL (ref 31.5–35.7)
MCV: 89 fL (ref 79–97)
MONOS ABS: 0.5 10*3/uL (ref 0.1–0.9)
Monocytes: 9 %
Neutrophils Absolute: 3.1 10*3/uL (ref 1.4–7.0)
Neutrophils: 53 %
PLATELETS: 278 10*3/uL (ref 150–379)
RBC: 4.05 x10E6/uL (ref 3.77–5.28)
RDW: 13.9 % (ref 12.3–15.4)
WBC: 5.8 10*3/uL (ref 3.4–10.8)

## 2017-04-12 LAB — URINALYSIS, MICROSCOPIC ONLY: Casts: NONE SEEN /lpf

## 2017-04-12 LAB — TSH: TSH: 2.47 u[IU]/mL (ref 0.450–4.500)

## 2017-04-12 LAB — LIPID PANEL
Chol/HDL Ratio: 5.3 ratio — ABNORMAL HIGH (ref 0.0–4.4)
Cholesterol, Total: 268 mg/dL — ABNORMAL HIGH (ref 100–199)
HDL: 51 mg/dL (ref >39)
LDL Calculated: 155 mg/dL — ABNORMAL HIGH (ref 0–99)
Triglycerides: 308 mg/dL — ABNORMAL HIGH (ref 0–149)
VLDL Cholesterol Cal: 62 mg/dL — ABNORMAL HIGH (ref 5–40)

## 2017-04-12 LAB — T4, FREE: FREE T4: 1.16 ng/dL (ref 0.82–1.77)

## 2017-04-12 LAB — HEMOGLOBIN A1C
ESTIMATED AVERAGE GLUCOSE: 134 mg/dL
HEMOGLOBIN A1C: 6.3 % — AB (ref 4.8–5.6)

## 2017-04-12 LAB — URINE CULTURE: Organism ID, Bacteria: NO GROWTH

## 2017-04-12 NOTE — Assessment & Plan Note (Signed)
Update A1C today. If >6.6%, will recommend metformin ER 500 mg daily.

## 2017-04-12 NOTE — Assessment & Plan Note (Signed)
Update labs today and adjust treatment if indicated.

## 2017-04-12 NOTE — Assessment & Plan Note (Signed)
She is interested in [place]. Advised she check with her prescription benefit manager to see if it is covered.

## 2017-04-12 NOTE — Assessment & Plan Note (Signed)
Series completed today.

## 2017-04-21 ENCOUNTER — Encounter: Payer: Self-pay | Admitting: Physician Assistant

## 2017-04-29 DIAGNOSIS — M546 Pain in thoracic spine: Secondary | ICD-10-CM | POA: Diagnosis not present

## 2017-04-29 DIAGNOSIS — Z6827 Body mass index (BMI) 27.0-27.9, adult: Secondary | ICD-10-CM | POA: Diagnosis not present

## 2017-04-29 DIAGNOSIS — M791 Myalgia, unspecified site: Secondary | ICD-10-CM | POA: Diagnosis not present

## 2017-06-10 ENCOUNTER — Other Ambulatory Visit: Payer: Self-pay | Admitting: Physician Assistant

## 2017-06-10 DIAGNOSIS — G609 Hereditary and idiopathic neuropathy, unspecified: Secondary | ICD-10-CM

## 2017-06-10 NOTE — Telephone Encounter (Signed)
Patient is requesting a refill of the following medications: Requested Prescriptions   Pending Prescriptions Disp Refills  . LYRICA 50 MG capsule [Pharmacy Med Name: LYRICA 50MG  CAPSULES] 90 capsule 0    Sig: TAKE 1 CAPSULE BY MOUTH THREE TIMES DAILY    Date of patient request: 06/10/17 Last office visit: 04/11/17  Date of last refill: 03/14/17  Last refill amount:#90 0RF Follow up time period per chart: none scheduled

## 2017-06-23 ENCOUNTER — Encounter: Payer: Self-pay | Admitting: Physician Assistant

## 2017-07-29 ENCOUNTER — Other Ambulatory Visit: Payer: Self-pay | Admitting: Physician Assistant

## 2017-07-29 DIAGNOSIS — G609 Hereditary and idiopathic neuropathy, unspecified: Secondary | ICD-10-CM

## 2017-07-29 NOTE — Telephone Encounter (Signed)
Lyrica 50 mg capsules   LOV 04/11/17 with Harrison Mons  Last refill:  06/10/17  #90   0 refills  [id number] - [place], [place] - [phone]

## 2017-08-02 ENCOUNTER — Telehealth: Payer: Self-pay | Admitting: *Deleted

## 2017-08-02 ENCOUNTER — Other Ambulatory Visit: Payer: Self-pay | Admitting: *Deleted

## 2017-08-02 DIAGNOSIS — G609 Hereditary and idiopathic neuropathy, unspecified: Secondary | ICD-10-CM

## 2017-08-02 MED ORDER — PREGABALIN 50 MG PO CAPS: 50.0000 mg | ORAL_CAPSULE | Freq: Three times a day (TID) | ORAL | 0 refills | Status: DC

## 2017-08-02 NOTE — Telephone Encounter (Signed)
Spoke with Kelsey Warner ok'd to call in 1 month.  Patient states is going out of town and is out.   Patient scheduled an appointment and was advised needs to keep follow up for any additional refills.    Called prescription into pharmacy.

## 2017-08-12 ENCOUNTER — Encounter: Payer: Self-pay | Admitting: Physician Assistant

## 2017-08-12 ENCOUNTER — Other Ambulatory Visit: Payer: Self-pay

## 2017-08-12 ENCOUNTER — Ambulatory Visit (INDEPENDENT_AMBULATORY_CARE_PROVIDER_SITE_OTHER): Payer: Medicare Other | Admitting: Physician Assistant

## 2017-08-12 VITALS — BP 126/68 | HR 85 | Temp 98.8°F | Resp 16 | Ht 67.5 in | Wt 175.0 lb

## 2017-08-12 DIAGNOSIS — E038 Other specified hypothyroidism: Secondary | ICD-10-CM

## 2017-08-12 DIAGNOSIS — R1013 Epigastric pain: Secondary | ICD-10-CM

## 2017-08-12 DIAGNOSIS — E785 Hyperlipidemia, unspecified: Secondary | ICD-10-CM

## 2017-08-12 DIAGNOSIS — M21961 Unspecified acquired deformity of right lower leg: Secondary | ICD-10-CM | POA: Diagnosis not present

## 2017-08-12 DIAGNOSIS — M4714 Other spondylosis with myelopathy, thoracic region: Secondary | ICD-10-CM | POA: Diagnosis not present

## 2017-08-12 DIAGNOSIS — G609 Hereditary and idiopathic neuropathy, unspecified: Secondary | ICD-10-CM

## 2017-08-12 DIAGNOSIS — R7303 Prediabetes: Secondary | ICD-10-CM | POA: Diagnosis not present

## 2017-08-12 DIAGNOSIS — B001 Herpesviral vesicular dermatitis: Secondary | ICD-10-CM

## 2017-08-12 MED ORDER — PREGABALIN 100 MG PO CAPS: 100.0000 mg | ORAL_CAPSULE | Freq: Three times a day (TID) | ORAL | 5 refills | Status: AC

## 2017-08-12 MED ORDER — OMEPRAZOLE 40 MG PO CPDR: 40.0000 mg | DELAYED_RELEASE_CAPSULE | Freq: Every day | ORAL | 3 refills | Status: DC

## 2017-08-12 MED ORDER — VALACYCLOVIR HCL 1 G PO TABS: ORAL_TABLET | ORAL | 99 refills | Status: AC

## 2017-08-12 MED ORDER — TRAMADOL HCL 50 MG PO TABS
50.0000 mg | ORAL_TABLET | Freq: Three times a day (TID) | ORAL | 0 refills | Status: AC | PRN
Start: 2017-08-12 — End: ?

## 2017-08-12 MED ORDER — LEVOTHYROXINE SODIUM 100 MCG PO TABS: 100.0000 ug | ORAL_TABLET | Freq: Every day | ORAL | 3 refills | Status: AC

## 2017-08-12 MED ORDER — CARISOPRODOL 350 MG PO TABS: 350.0000 mg | ORAL_TABLET | Freq: Every day | ORAL | 0 refills | Status: AC

## 2017-08-12 NOTE — Patient Instructions (Addendum)
For the Valtrex: Take 2 tablets as soon as you feel the symptoms coming on. Take 2 more tablets 12 hours later. That's it!  Please schedule a visit with your eye specialist. Call [employer] and find out what nutrition therapy coverage they offer.  I have INCREASED the Lyrica dose from 50 mg to 100 mg. Continue just twice daily.  Try putting padding inside your RIGHT shoe (get the kind that has adhesive on one side, and cut it into strips for your different shoe styles.  Try the omeprazole for the abdominal pain. Things that often make reflux symptoms worse: Caffeine Carbonation (soda) Spicy foods Acidic foods (like tomato sauce, orange juice, lemonade) Fatty foods (including whole milk and ice cream) Stress (feeling sad, worried, nervous) Nicotine Alcohol NSAIDS (non-steroidal anti-inflammatories, like ibuprofen (Advil, Motrin) or naproxen (Aleve)).  Go ahead and call [facility] to schedule your next visit with me there. [phone].   IF you received an x-ray today, you will receive an invoice from [facility]. Please contact [facility] at [phone] with questions or concerns regarding your invoice.   IF you received labwork today, you will receive an invoice from [place]. Please contact [facility] at [phone] with questions or concerns regarding your invoice.   Our billing staff will not be able to assist you with questions regarding bills from these companies.  You will be contacted with the lab results as soon as they are available. The fastest way to get your results is to activate your My Chart account. Instructions are located on the last page of this paperwork. If you have not heard from [place] regarding the results in 2 weeks, please contact this office.

## 2017-08-12 NOTE — Progress Notes (Signed)
Subjective:    Patient ID: Kelsey Warner, female    DOB: Oct 05, 1940, 77 y.o.   MRN: [id number]  Patient presents for medication refill. She reports that Lyrica is partially helping her neuropathy and that she takes it twice daily instead of 3x. Her previous valtrex prescription has expired, and she needs refills on soma and tramadol. She also describes periodic sudden epigastric pain (which she believes may be related to gallbladder). The pain has occurred 3x with the last time being approximately 6 months ago. 2/3 times have been relieved with benadryl/lemon juice home remedy. Attacks last between 30 min to an hour. Of note, she gives an update on chronic problems: - Trigeminal neuralgia -  right sided vision digressing  - Shrinking in right foot which she describes affecting her inability to fit in shoes she was previously able.  - Continued persistent low back pain. - Intermittent right lateral upper calf swelling/hardening. Occurs after activity and is relieved by "Active On" gel.   Her son has also recently passed away, adding to her life stressors.           Review of Systems  Constitutional: Negative for appetite change, chills, fever and unexpected weight change.  HENT: Negative.   Eyes: Positive for visual disturbance.  Respiratory: Positive for shortness of breath (exertional). Negative for cough and chest tightness.   Cardiovascular: Positive for leg swelling. Negative for chest pain and palpitations.  Gastrointestinal: Positive for blood in stool (from hemorrhoids ). Negative for abdominal pain, constipation, diarrhea, nausea and vomiting.  Genitourinary: Positive for urgency (stress incontinence). Negative for dysuria.  Musculoskeletal: Positive for back pain. Negative for joint swelling and myalgias.  Skin: Positive for color change (b/l foot/ankle/lower tibia every June ).  Neurological: Positive for numbness. Negative for dizziness and headaches.        Prior  to Admission medications   Medication Sig Start Date End Date Taking? Authorizing Provider  aspirin 81 MG tablet Take 81 mg by mouth daily.      [provider]  carisoprodol (SOMA) 350 MG tablet Take 1 tablet (350 mg total) by mouth at bedtime. 04/11/17   Harrison Mons, PA-C  Cholecalciferol (VITAMIN D-3 PO) Take 1 tablet by mouth daily. TAKE 1 TAB DAILY     [provider]  KRILL OIL PO Take 1 tablet by mouth.    [provider]  levothyroxine (SYNTHROID, LEVOTHROID) 100 MCG tablet Take 1 tablet (100 mcg total) by mouth daily before breakfast. 09/27/16   Harrison Mons, PA-C  MAGNESIUM PO Take 1 capsule by mouth.    [provider]  pregabalin (LYRICA) 50 MG capsule Take 1 capsule (50 mg total) by mouth 3 (three) times daily. 08/02/17   Harrison Mons, PA-C  pyridOXINE (VITAMIN B-6) 100 MG tablet Take 100 mg by mouth daily.    [provider]  rizatriptan (MAXALT) 5 MG tablet Take 1 tablet (5 mg total) by mouth as needed for migraine. May repeat in 2 hours if needed Patient not taking: Reported on 09/24/2016 09/12/15   Harrison Mons, PA-C  traMADol (ULTRAM) 50 MG tablet Take 1-2 tablets (50-100 mg total) by mouth every 8 (eight) hours as needed. 04/11/17   Harrison Mons, PA-C  valACYclovir (VALTREX) 1000 MG tablet TAKE 2 TABLETS BY MOUTH TWICE DAILY FOR HERPES SIMPLEX ON LIP AS DIRECTED 09/12/15   Harrison Mons, PA-C  vitamin B-12 (CYANOCOBALAMIN) 1000 MCG tablet Take 1,000 mcg by mouth daily.      [provider]   Patient Active Problem List   Diagnosis Date Noted  . Prediabetes 04/11/2017  . Thoracic spondylosis with myelopathy 04/11/2017  . Trigeminal neuralgia of right side of face 09/24/2016  . Migraine 09/12/2015  . Fever blister 09/12/2015  . BMI 28.0-28.9,adult 09/12/2015  . Peripheral neuropathy 09/12/2015  . Osteopenia 09/12/2015  . Varicose veins 09/12/2015  . 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 Hyperlipidemia 09/12/2015  . Hypothyroid 12/19/2011  . Dyspnea 01/02/2011   Allergies  Allergen Reactions  . Adhesive [Tape] Other (See Comments)    blisters  . Other     Rolilox  . Penicillins     REACTION: hives, rash  . Statins Other (See Comments)    Tried multiple statins, all caused muscle aches. 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     Objective:   Physical Exam  Eyes: Pupils are equal, round, and reactive to light.  Neck: No thyromegaly present.  Cardiovascular: Normal rate and regular rhythm.  No murmur heard. Pulmonary/Chest: Effort normal and breath sounds normal.  Lymphadenopathy:    She has no cervical adenopathy.  Skin: Skin is warm and dry.          Assessment & Plan:  1. Prediabetes Reevaluate need for medication intervention pending following labs:  - Comprehensive metabolic panel - Lipid panel - Hemoglobin A1c - Urinalysis, dipstick only  2. Hyperlipidemia, unspecified hyperlipidemia type Continue current regimen, and reevaluate based on following labs: - Comprehensive metabolic panel - Lipid panel  3. Other specified hypothyroidism Well controlled, continue current medication regimen. - TSH - T4 - T3, free - levothyroxine (SYNTHROID, LEVOTHROID) 100 MCG tablet; Take 1 tablet (100 mcg total) by mouth daily before breakfast.  Dispense: 90 tablet; Refill: 3  4. Idiopathic peripheral neuropathy INCREASED dose as following; reevaluate efficacy at next appointment. - pregabalin (LYRICA) 100 MG capsule; Take 1 capsule (100 mg total) by mouth 3 (three) times daily.  Dispense: 60 capsule; Refill: 5  5. Fever blister Well controlled, continue regimen and make sure to adhere to dosing instructions: - valACYclovir (VALTREX) 1000 MG tablet; TAKE 2 TABLETS BY MOUTH TWICE DAILY FOR HERPES SIMPLEX ON LIP AS DIRECTED  Dispense: 30 tablet; Refill: prn  6. Thoracic  spondylosis with myelopathy Continue current regimen; will reevaluate at next appointment.  - pregabalin (LYRICA) 100 MG capsule; Take 1 capsule (100 mg total) by mouth 3 (three) times daily.  Dispense: 60 capsule; Refill: 5 - traMADol (ULTRAM) 50 MG tablet; Take 1-2 tablets (50-100 mg total) by mouth every 8 (eight) hours as needed.  Dispense: 30 tablet; Refill: 0 - carisoprodol (SOMA) 350 MG tablet; Take 1 tablet (350 mg total) by mouth at bedtime.  Dispense: 30 tablet; Refill: 0  7. Abdominal pain, epigastric - omeprazole (PRILOSEC) 40 MG capsule; Take 1 capsule (40 mg total) by mouth daily.  Dispense: 30 capsule; Refill: 3  8. Deformity of right foot  - most likely due to thoracic spondylosis. May purchase OTC foot padding/shoe cushions to help alleviate the discomfort and restrictions.  Return in about 6 months (around 02/12/2018) for re-evaluation of glucose, cholesterol, neuropathy.

## 2017-08-12 NOTE — Progress Notes (Signed)
Patient ID: Kelsey Warner, female    DOB: 1940/04/29, 77 y.o.   MRN: [id number]  PCP: Harrison Mons, PA-C  Chief Complaint  Patient presents with  . Medication Refill    lyrica, valtrex,    Subjective:   Presents for evaluation of prediabetes, hyperlipidemia, hypothyroidism and idiopathic peripheral neuropathy.    Pregabalin is helping but she continues to have symptoms.  She notes that she takes it twice daily rather than 3 times daily.  She needs refills of valacyclovir, carisoprodol and tramadol.  Episodic epigastric pain.  Sudden onset.  Has had 3 episodes, most recent was 6 months ago.  Twice, her symptoms resolved with a home remedy of diphenhydramine and lemon juice.  Each episode lasts between 30 and 60 minutes.    Some increased stress urinary incontinence. Chronic back pain.  Not worsening. Vision of the right eye continues to decline. Intermittent pain in the upper outer right calf.  Describes it as swelling and "hardening."  The symptoms follow activity and relieved by use of OTC active on gel. She notes that her right foot seems to have become smaller in the last several months.  Shoes that previously fit her are now too big on the right side.   Review of Systems Constitutional: Negative for appetite change, chills, fever and unexpected weight change.  HENT: Negative.   Eyes: Positive for visual disturbance.  Respiratory: Positive for shortness of breath (exertional). Negative for cough and chest tightness.   Cardiovascular: Positive for leg swelling. Negative for chest pain and palpitations.  Gastrointestinal: Positive for blood in stool (from hemorrhoids ). Negative for abdominal pain, constipation, diarrhea, nausea and vomiting.  Genitourinary: Positive for urgency (stress incontinence). Negative for dysuria.  Musculoskeletal: Positive for back pain. Negative for joint swelling and myalgias.  Skin: Positive for color change (b/l foot/ankle/lower tibia  every June ).  Neurological: Positive for numbness. Negative for dizziness and headaches.       Patient Active Problem List   Diagnosis Date Noted  . Prediabetes 04/11/2017  . Thoracic spondylosis with myelopathy 04/11/2017  . Trigeminal neuralgia of right side of face 09/24/2016  . Migraine 09/12/2015  . Fever blister 09/12/2015  . BMI 28.0-28.9,adult 09/12/2015  . Peripheral neuropathy 09/12/2015  . Osteopenia 09/12/2015  . Varicose veins 09/12/2015  . 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 Hyperlipidemia 09/12/2015  . Hypothyroid 12/19/2011  . Dyspnea 01/02/2011     Prior to Admission medications   Medication Sig Start Date End Date Taking? Authorizing Provider  aspirin 81 MG tablet Take 81 mg by mouth daily.      [provider]  carisoprodol (SOMA) 350 MG tablet Take 1 tablet (350 mg total) by mouth at bedtime. 04/11/17   Harrison Mons, PA-C  Cholecalciferol (VITAMIN D-3 PO) Take 1 tablet by mouth daily. TAKE 1 TAB DAILY     [provider]  KRILL OIL PO Take 1 tablet by mouth.    [provider]  levothyroxine (SYNTHROID, LEVOTHROID) 100 MCG tablet Take 1 tablet (100 mcg total) by mouth daily before breakfast. 09/27/16   Harrison Mons, PA-C  MAGNESIUM PO Take 1 capsule by mouth.    [provider]  pregabalin (LYRICA) 50 MG capsule Take 1 capsule (50 mg total) by mouth 3 (three) times daily. 08/02/17   Harrison Mons, PA-C  pyridOXINE (VITAMIN B-6) 100 MG tablet Take 100 mg by mouth daily.  [provider]  rizatriptan (MAXALT) 5 MG tablet Take 1 tablet (5 mg total) by mouth as needed for migraine. May repeat in 2 hours if needed Patient not taking: Reported on 09/24/2016 09/12/15   Harrison Mons, PA-C  traMADol (ULTRAM) 50 MG tablet Take 1-2 tablets (50-100 mg total) by mouth every 8 (eight) hours as needed. 04/11/17   Harrison Mons, PA-C  valACYclovir  (VALTREX) 1000 MG tablet TAKE 2 TABLETS BY MOUTH TWICE DAILY FOR HERPES SIMPLEX ON LIP AS DIRECTED 09/12/15   Harrison Mons, PA-C  vitamin B-12 (CYANOCOBALAMIN) 1000 MCG tablet Take 1,000 mcg by mouth daily.      [provider]     Allergies  Allergen Reactions  . Adhesive [Tape] Other (See Comments)    blisters  . Other     Rolilox  . Penicillins     REACTION: hives, rash  . Statins Other (See Comments)    Tried multiple statins, all caused muscle aches. 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       Objective:  Physical Exam  Constitutional: She is oriented to person, place, and time. She appears well-developed and well-nourished. She is active and cooperative. No distress.  BP 126/68   Pulse 85   Temp 98.8 F (37.1 C)   Resp 16   Ht 5' 7.5" (1.715 m)   Wt 175 lb (79.4 kg)   SpO2 97%   BMI 27.00 kg/m   HENT:  Head: Normocephalic and atraumatic.  Right Ear: Hearing normal.  Left Ear: Hearing normal.  Eyes: Conjunctivae are normal. No scleral icterus.  Neck: Normal range of motion. Neck supple. No thyromegaly present.  Cardiovascular: Normal rate, regular rhythm and normal heart sounds.  Pulses:      Radial pulses are 2+ on the right side, and 2+ on the left side.  Pulmonary/Chest: Effort normal and breath sounds normal.  Lymphadenopathy:       Head (right side): No tonsillar, no preauricular, no posterior auricular and no occipital adenopathy present.       Head (left side): No tonsillar, no preauricular, no posterior auricular and no occipital adenopathy present.    She has no cervical adenopathy.       Right: No supraclavicular adenopathy present.       Left: No supraclavicular adenopathy present.  Neurological: She is alert and oriented to person, place, and time. No sensory deficit.  Skin: Skin is warm, dry and intact. No rash noted. No cyanosis or erythema. Nails show no clubbing.  Psychiatric: She has a normal mood and  affect. Her speech is normal and behavior is normal.    Wt Readings from Last 3 Encounters:  08/12/17 175 lb (79.4 kg)  04/11/17 177 lb 3.2 oz (80.4 kg)  09/24/16 172 lb 3.2 oz (78.1 kg)          Assessment & Plan:   Problem List Items Addressed This Visit    Thoracic spondylosis with myelopathy    Stable/controlled.  Continue current treatment.      Relevant Medications   pregabalin (LYRICA) 100 MG capsule   traMADol (ULTRAM) 50 MG tablet   carisoprodol (SOMA) 350 MG tablet   Prediabetes - Primary    Await lab results.  Would initiate metformin if A1c is greater than 7%.      Relevant Orders   Comprehensive metabolic panel (Completed)   Lipid panel (Completed)   Hemoglobin A1c (Completed)   Urinalysis, dipstick only (Completed)   Peripheral neuropathy  Improved but not resolved continue twice daily pregabalin, but increase dose.      Relevant Medications   pregabalin (LYRICA) 100 MG capsule   carisoprodol (SOMA) 350 MG tablet   Hypothyroid    Clinically euthyroid.  Await lab results and adjust treatment regimen as indicated.      Relevant Medications   levothyroxine (SYNTHROID, LEVOTHROID) 100 MCG tablet   Other Relevant Orders   TSH (Completed)   T4 (Completed)   T3, free (Completed)   Hyperlipidemia    Labs.  Previous intolerance to multiple statins, believe they caused her peripheral neuropathy.  Unwilling to take any other lipid-lowering medications.      Relevant Orders   Comprehensive metabolic panel (Completed)   Lipid panel (Completed)   Fever blister    Continue PRN valacyclovir.      Relevant Medications   valACYclovir (VALTREX) 1000 MG tablet    Other Visit Diagnoses    Abdominal pain, epigastric       Trial of PPI therapy.  Reevaluate if symptoms persist.   Relevant Medications   omeprazole (PRILOSEC) 40 MG capsule   Deformity of right foot       Unclear etiology, no apparent atrophy on exam.  Recommend padding inside her shoe for  improved fit and continued observation of this issue.       Return in about 6 months (around 02/12/2018) for re-evaluation of glucose, cholesterol, neuropathy.   Fara Chute, PA-C [facility]

## 2017-08-13 LAB — URINALYSIS, DIPSTICK ONLY
BILIRUBIN UA: NEGATIVE
Glucose, UA: NEGATIVE
Ketones, UA: NEGATIVE
Leukocytes, UA: NEGATIVE
NITRITE UA: NEGATIVE
PH UA: 6.5 (ref 5.0–7.5)
Protein, UA: NEGATIVE
RBC UA: NEGATIVE
SPEC GRAV UA: 1.017 (ref 1.005–1.030)
Urobilinogen, Ur: 0.2 mg/dL (ref 0.2–1.0)

## 2017-08-13 LAB — LIPID PANEL
CHOL/HDL RATIO: 6.1 ratio — AB (ref 0.0–4.4)
Cholesterol, Total: 300 mg/dL — ABNORMAL HIGH (ref 100–199)
HDL: 49 mg/dL (ref >39)
LDL Calculated: 207 mg/dL — ABNORMAL HIGH (ref 0–99)
TRIGLYCERIDES: 218 mg/dL — AB (ref 0–149)
VLDL CHOLESTEROL CAL: 44 mg/dL — AB (ref 5–40)

## 2017-08-13 LAB — COMPREHENSIVE METABOLIC PANEL
A/G RATIO: 1.5 (ref 1.2–2.2)
ALT: 28 IU/L (ref 0–32)
AST: 27 IU/L (ref 0–40)
Albumin: 4.5 g/dL (ref 3.5–4.8)
Alkaline Phosphatase: 73 IU/L (ref 39–117)
BILIRUBIN TOTAL: 0.2 mg/dL (ref 0.0–1.2)
BUN/Creatinine Ratio: 14 (ref 12–28)
BUN: 14 mg/dL (ref 8–27)
CALCIUM: 9.9 mg/dL (ref 8.7–10.3)
CHLORIDE: 99 mmol/L (ref 96–106)
CO2: 25 mmol/L (ref 20–29)
Creatinine, Ser: 0.98 mg/dL (ref 0.57–1.00)
GFR, EST AFRICAN AMERICAN: 66 mL/min/{1.73_m2} (ref >59)
GFR, EST NON AFRICAN AMERICAN: 57 mL/min/{1.73_m2} — AB (ref >59)
GLUCOSE: 113 mg/dL — AB (ref 65–99)
Globulin, Total: 3 g/dL (ref 1.5–4.5)
POTASSIUM: 4.1 mmol/L (ref 3.5–5.2)
Sodium: 141 mmol/L (ref 134–144)
TOTAL PROTEIN: 7.5 g/dL (ref 6.0–8.5)

## 2017-08-13 LAB — T3, FREE: T3 FREE: 2.4 pg/mL (ref 2.0–4.4)

## 2017-08-13 LAB — T4: T4, Total: 7.4 ug/dL (ref 4.5–12.0)

## 2017-08-13 LAB — HEMOGLOBIN A1C
Est. average glucose Bld gHb Est-mCnc: 131 mg/dL
Hgb A1c MFr Bld: 6.2 % — ABNORMAL HIGH (ref 4.8–5.6)

## 2017-08-13 LAB — TSH: TSH: 1.06 u[IU]/mL (ref 0.450–4.500)

## 2017-08-18 NOTE — Assessment & Plan Note (Signed)
Labs.  Previous intolerance to multiple statins, believe they caused her peripheral neuropathy.  Unwilling to take any other lipid-lowering medications.

## 2017-08-18 NOTE — Assessment & Plan Note (Signed)
Stable/controlled.  Continue current treatment.

## 2017-08-18 NOTE — Assessment & Plan Note (Signed)
Continue PRN valacyclovir.

## 2017-08-18 NOTE — Assessment & Plan Note (Signed)
Clinically euthyroid.  Await lab results and adjust treatment regimen as indicated.

## 2017-08-18 NOTE — Assessment & Plan Note (Signed)
Await lab results.  Would initiate metformin if A1c is greater than 7%.

## 2017-08-18 NOTE — Assessment & Plan Note (Signed)
Improved but not resolved continue twice daily pregabalin, but increase dose.

## 2017-09-12 ENCOUNTER — Encounter: Payer: Self-pay | Admitting: Physician Assistant

## 2017-09-12 DIAGNOSIS — R7303 Prediabetes: Secondary | ICD-10-CM

## 2017-09-29 DIAGNOSIS — H5213 Myopia, bilateral: Secondary | ICD-10-CM | POA: Diagnosis not present

## 2017-09-29 DIAGNOSIS — H524 Presbyopia: Secondary | ICD-10-CM | POA: Diagnosis not present

## 2017-09-29 DIAGNOSIS — H2513 Age-related nuclear cataract, bilateral: Secondary | ICD-10-CM | POA: Diagnosis not present

## 2017-09-29 DIAGNOSIS — H25013 Cortical age-related cataract, bilateral: Secondary | ICD-10-CM | POA: Diagnosis not present

## 2017-12-02 DIAGNOSIS — T50905A Adverse effect of unspecified drugs, medicaments and biological substances, initial encounter: Secondary | ICD-10-CM | POA: Diagnosis not present

## 2017-12-02 DIAGNOSIS — Z1231 Encounter for screening mammogram for malignant neoplasm of breast: Secondary | ICD-10-CM | POA: Diagnosis not present

## 2017-12-02 DIAGNOSIS — E2839 Other primary ovarian failure: Secondary | ICD-10-CM | POA: Diagnosis not present

## 2017-12-02 DIAGNOSIS — G620 Drug-induced polyneuropathy: Secondary | ICD-10-CM | POA: Diagnosis not present

## 2017-12-02 DIAGNOSIS — Z23 Encounter for immunization: Secondary | ICD-10-CM | POA: Diagnosis not present

## 2017-12-12 ENCOUNTER — Other Ambulatory Visit: Payer: Self-pay | Admitting: Physician Assistant

## 2017-12-12 DIAGNOSIS — Z1231 Encounter for screening mammogram for malignant neoplasm of breast: Secondary | ICD-10-CM

## 2017-12-29 DIAGNOSIS — E2839 Other primary ovarian failure: Secondary | ICD-10-CM | POA: Diagnosis not present

## 2017-12-29 DIAGNOSIS — M85851 Other specified disorders of bone density and structure, right thigh: Secondary | ICD-10-CM | POA: Diagnosis not present

## 2017-12-30 DIAGNOSIS — E663 Overweight: Secondary | ICD-10-CM | POA: Diagnosis not present

## 2017-12-30 DIAGNOSIS — R7303 Prediabetes: Secondary | ICD-10-CM | POA: Diagnosis not present

## 2018-01-12 ENCOUNTER — Ambulatory Visit: Payer: Medicare Other

## 2018-01-15 ENCOUNTER — Ambulatory Visit
Admission: RE | Admit: 2018-01-15 | Discharge: 2018-01-15 | Disposition: A | Discharge status: Home / Self Care | Payer: Medicare Other | Source: Ambulatory Visit | Attending: Physician Assistant | Admitting: Physician Assistant

## 2018-01-15 DIAGNOSIS — Z1231 Encounter for screening mammogram for malignant neoplasm of breast: Secondary | ICD-10-CM

## 2018-01-27 DIAGNOSIS — G620 Drug-induced polyneuropathy: Secondary | ICD-10-CM | POA: Diagnosis not present

## 2018-01-27 DIAGNOSIS — E785 Hyperlipidemia, unspecified: Secondary | ICD-10-CM | POA: Diagnosis not present

## 2018-01-27 DIAGNOSIS — G8929 Other chronic pain: Secondary | ICD-10-CM | POA: Diagnosis not present

## 2018-01-27 DIAGNOSIS — M47817 Spondylosis without myelopathy or radiculopathy, lumbosacral region: Secondary | ICD-10-CM | POA: Diagnosis not present

## 2018-01-27 DIAGNOSIS — M5442 Lumbago with sciatica, left side: Secondary | ICD-10-CM | POA: Diagnosis not present

## 2018-01-27 DIAGNOSIS — E039 Hypothyroidism, unspecified: Secondary | ICD-10-CM | POA: Diagnosis not present

## 2018-01-27 DIAGNOSIS — R7303 Prediabetes: Secondary | ICD-10-CM | POA: Diagnosis not present

## 2018-01-27 DIAGNOSIS — M25552 Pain in left hip: Secondary | ICD-10-CM | POA: Diagnosis not present

## 2018-01-27 DIAGNOSIS — M438X6 Other specified deforming dorsopathies, lumbar region: Secondary | ICD-10-CM | POA: Diagnosis not present

## 2018-01-27 DIAGNOSIS — M5136 Other intervertebral disc degeneration, lumbar region: Secondary | ICD-10-CM | POA: Diagnosis not present

## 2018-01-27 DIAGNOSIS — M1612 Unilateral primary osteoarthritis, left hip: Secondary | ICD-10-CM | POA: Diagnosis not present

## 2018-01-27 DIAGNOSIS — Z6828 Body mass index (BMI) 28.0-28.9, adult: Secondary | ICD-10-CM | POA: Diagnosis not present

## 2018-01-28 DIAGNOSIS — M7062 Trochanteric bursitis, left hip: Secondary | ICD-10-CM | POA: Diagnosis not present

## 2018-02-23 DIAGNOSIS — M5442 Lumbago with sciatica, left side: Secondary | ICD-10-CM | POA: Diagnosis not present

## 2018-02-23 DIAGNOSIS — G8929 Other chronic pain: Secondary | ICD-10-CM | POA: Diagnosis not present

## 2018-02-23 DIAGNOSIS — M419 Scoliosis, unspecified: Secondary | ICD-10-CM | POA: Diagnosis not present

## 2018-02-23 DIAGNOSIS — M5136 Other intervertebral disc degeneration, lumbar region: Secondary | ICD-10-CM | POA: Diagnosis not present

## 2018-03-30 DIAGNOSIS — M419 Scoliosis, unspecified: Secondary | ICD-10-CM | POA: Diagnosis not present

## 2018-03-30 DIAGNOSIS — M5136 Other intervertebral disc degeneration, lumbar region: Secondary | ICD-10-CM | POA: Diagnosis not present

## 2018-04-02 DIAGNOSIS — M419 Scoliosis, unspecified: Secondary | ICD-10-CM | POA: Diagnosis not present

## 2018-04-02 DIAGNOSIS — M5136 Other intervertebral disc degeneration, lumbar region: Secondary | ICD-10-CM | POA: Diagnosis not present

## 2018-04-06 DIAGNOSIS — M5136 Other intervertebral disc degeneration, lumbar region: Secondary | ICD-10-CM | POA: Diagnosis not present

## 2018-04-06 DIAGNOSIS — M419 Scoliosis, unspecified: Secondary | ICD-10-CM | POA: Diagnosis not present

## 2018-04-10 DIAGNOSIS — M419 Scoliosis, unspecified: Secondary | ICD-10-CM | POA: Diagnosis not present

## 2018-04-10 DIAGNOSIS — M5136 Other intervertebral disc degeneration, lumbar region: Secondary | ICD-10-CM | POA: Diagnosis not present

## 2018-04-15 DIAGNOSIS — M419 Scoliosis, unspecified: Secondary | ICD-10-CM | POA: Diagnosis not present

## 2018-04-15 DIAGNOSIS — M5136 Other intervertebral disc degeneration, lumbar region: Secondary | ICD-10-CM | POA: Diagnosis not present

## 2018-04-16 DIAGNOSIS — M5136 Other intervertebral disc degeneration, lumbar region: Secondary | ICD-10-CM | POA: Diagnosis not present

## 2018-04-16 DIAGNOSIS — M419 Scoliosis, unspecified: Secondary | ICD-10-CM | POA: Diagnosis not present

## 2018-04-20 DIAGNOSIS — M419 Scoliosis, unspecified: Secondary | ICD-10-CM | POA: Diagnosis not present

## 2018-04-20 DIAGNOSIS — M545 Low back pain: Secondary | ICD-10-CM | POA: Diagnosis not present

## 2018-04-30 DIAGNOSIS — M5442 Lumbago with sciatica, left side: Secondary | ICD-10-CM | POA: Diagnosis not present

## 2018-04-30 DIAGNOSIS — E039 Hypothyroidism, unspecified: Secondary | ICD-10-CM | POA: Diagnosis not present

## 2018-04-30 DIAGNOSIS — E119 Type 2 diabetes mellitus without complications: Secondary | ICD-10-CM | POA: Diagnosis not present

## 2018-04-30 DIAGNOSIS — G620 Drug-induced polyneuropathy: Secondary | ICD-10-CM | POA: Diagnosis not present

## 2018-04-30 DIAGNOSIS — E559 Vitamin D deficiency, unspecified: Secondary | ICD-10-CM | POA: Diagnosis not present

## 2018-04-30 DIAGNOSIS — Z0000 Encounter for general adult medical examination without abnormal findings: Secondary | ICD-10-CM | POA: Diagnosis not present

## 2018-04-30 DIAGNOSIS — G8929 Other chronic pain: Secondary | ICD-10-CM | POA: Diagnosis not present

## 2018-04-30 DIAGNOSIS — E785 Hyperlipidemia, unspecified: Secondary | ICD-10-CM | POA: Diagnosis not present

## 2018-05-18 DIAGNOSIS — M438X4 Other specified deforming dorsopathies, thoracic region: Secondary | ICD-10-CM | POA: Diagnosis not present

## 2018-05-18 DIAGNOSIS — M47817 Spondylosis without myelopathy or radiculopathy, lumbosacral region: Secondary | ICD-10-CM | POA: Diagnosis not present

## 2018-05-18 DIAGNOSIS — M5126 Other intervertebral disc displacement, lumbar region: Secondary | ICD-10-CM | POA: Diagnosis not present

## 2018-05-18 DIAGNOSIS — M5127 Other intervertebral disc displacement, lumbosacral region: Secondary | ICD-10-CM | POA: Diagnosis not present

## 2018-05-18 DIAGNOSIS — M47812 Spondylosis without myelopathy or radiculopathy, cervical region: Secondary | ICD-10-CM | POA: Diagnosis not present

## 2018-05-18 DIAGNOSIS — M5134 Other intervertebral disc degeneration, thoracic region: Secondary | ICD-10-CM | POA: Diagnosis not present

## 2018-05-18 DIAGNOSIS — M5031 Other cervical disc degeneration,  high cervical region: Secondary | ICD-10-CM | POA: Diagnosis not present

## 2018-05-26 DIAGNOSIS — M5416 Radiculopathy, lumbar region: Secondary | ICD-10-CM | POA: Diagnosis not present

## 2018-05-26 DIAGNOSIS — M4150 Other secondary scoliosis, site unspecified: Secondary | ICD-10-CM | POA: Diagnosis not present

## 2018-05-26 DIAGNOSIS — M5126 Other intervertebral disc displacement, lumbar region: Secondary | ICD-10-CM | POA: Diagnosis not present

## 2018-05-26 DIAGNOSIS — M48062 Spinal stenosis, lumbar region with neurogenic claudication: Secondary | ICD-10-CM | POA: Diagnosis not present

## 2018-05-29 ENCOUNTER — Other Ambulatory Visit: Payer: Self-pay | Admitting: Physician Assistant

## 2018-05-29 DIAGNOSIS — R911 Solitary pulmonary nodule: Secondary | ICD-10-CM

## 2018-06-03 ENCOUNTER — Ambulatory Visit
Admission: RE | Admit: 2018-06-03 | Discharge: 2018-06-03 | Disposition: A | Discharge status: Home / Self Care | Payer: Medicare Other | Source: Ambulatory Visit | Attending: Physician Assistant | Admitting: Physician Assistant

## 2018-06-03 ENCOUNTER — Other Ambulatory Visit: Payer: Self-pay

## 2018-06-03 DIAGNOSIS — R911 Solitary pulmonary nodule: Secondary | ICD-10-CM

## 2018-07-27 DIAGNOSIS — M5136 Other intervertebral disc degeneration, lumbar region: Secondary | ICD-10-CM | POA: Diagnosis not present

## 2018-07-27 DIAGNOSIS — M47816 Spondylosis without myelopathy or radiculopathy, lumbar region: Secondary | ICD-10-CM | POA: Diagnosis not present

## 2018-07-30 DIAGNOSIS — E119 Type 2 diabetes mellitus without complications: Secondary | ICD-10-CM | POA: Diagnosis not present

## 2018-07-30 DIAGNOSIS — E785 Hyperlipidemia, unspecified: Secondary | ICD-10-CM | POA: Diagnosis not present

## 2018-07-30 DIAGNOSIS — E039 Hypothyroidism, unspecified: Secondary | ICD-10-CM | POA: Diagnosis not present

## 2018-09-22 DIAGNOSIS — E119 Type 2 diabetes mellitus without complications: Secondary | ICD-10-CM | POA: Diagnosis not present

## 2018-09-22 DIAGNOSIS — E039 Hypothyroidism, unspecified: Secondary | ICD-10-CM | POA: Diagnosis not present

## 2018-09-22 DIAGNOSIS — E785 Hyperlipidemia, unspecified: Secondary | ICD-10-CM | POA: Diagnosis not present

## 2018-10-08 DIAGNOSIS — H524 Presbyopia: Secondary | ICD-10-CM | POA: Diagnosis not present

## 2018-10-08 DIAGNOSIS — H25013 Cortical age-related cataract, bilateral: Secondary | ICD-10-CM | POA: Diagnosis not present

## 2018-10-08 DIAGNOSIS — H2513 Age-related nuclear cataract, bilateral: Secondary | ICD-10-CM | POA: Diagnosis not present

## 2018-11-17 DIAGNOSIS — E039 Hypothyroidism, unspecified: Secondary | ICD-10-CM | POA: Diagnosis not present

## 2018-11-17 DIAGNOSIS — Z6829 Body mass index (BMI) 29.0-29.9, adult: Secondary | ICD-10-CM | POA: Diagnosis not present

## 2018-11-17 DIAGNOSIS — M5136 Other intervertebral disc degeneration, lumbar region: Secondary | ICD-10-CM | POA: Diagnosis not present

## 2018-12-21 DIAGNOSIS — N898 Other specified noninflammatory disorders of vagina: Secondary | ICD-10-CM | POA: Diagnosis not present

## 2018-12-30 DIAGNOSIS — N898 Other specified noninflammatory disorders of vagina: Secondary | ICD-10-CM | POA: Diagnosis not present

## 2019-01-07 DIAGNOSIS — M5136 Other intervertebral disc degeneration, lumbar region: Secondary | ICD-10-CM | POA: Diagnosis not present

## 2019-01-07 DIAGNOSIS — M48061 Spinal stenosis, lumbar region without neurogenic claudication: Secondary | ICD-10-CM | POA: Diagnosis not present

## 2019-01-07 DIAGNOSIS — M4726 Other spondylosis with radiculopathy, lumbar region: Secondary | ICD-10-CM | POA: Diagnosis not present

## 2019-01-22 ENCOUNTER — Other Ambulatory Visit: Payer: Self-pay | Admitting: Physician Assistant

## 2019-01-22 DIAGNOSIS — Z1231 Encounter for screening mammogram for malignant neoplasm of breast: Secondary | ICD-10-CM

## 2019-02-26 ENCOUNTER — Other Ambulatory Visit: Payer: Self-pay

## 2019-02-26 ENCOUNTER — Ambulatory Visit
Admission: RE | Admit: 2019-02-26 | Discharge: 2019-02-26 | Disposition: A | Discharge status: Home / Self Care | Payer: Medicare Other | Source: Ambulatory Visit | Attending: Physician Assistant | Admitting: Physician Assistant

## 2019-02-26 DIAGNOSIS — Z1231 Encounter for screening mammogram for malignant neoplasm of breast: Secondary | ICD-10-CM | POA: Diagnosis not present

## 2019-05-24 ENCOUNTER — Other Ambulatory Visit: Payer: Self-pay | Admitting: Ophthalmology

## 2019-06-21 ENCOUNTER — Institutional Professional Consult (permissible substitution): Payer: Medicare Other | Admitting: Diagnostic Neuroimaging

## 2019-07-12 ENCOUNTER — Encounter: Payer: Self-pay | Admitting: *Deleted

## 2019-07-14 ENCOUNTER — Ambulatory Visit (INDEPENDENT_AMBULATORY_CARE_PROVIDER_SITE_OTHER): Payer: Medicare Other | Admitting: Diagnostic Neuroimaging

## 2019-07-14 ENCOUNTER — Other Ambulatory Visit: Payer: Self-pay

## 2019-07-14 ENCOUNTER — Encounter: Payer: Self-pay | Admitting: Diagnostic Neuroimaging

## 2019-07-14 VITALS — BP 133/77 | HR 65 | Temp 97.5°F | Ht 67.0 in | Wt 189.8 lb

## 2019-07-14 DIAGNOSIS — F0781 Postconcussional syndrome: Secondary | ICD-10-CM

## 2019-07-14 DIAGNOSIS — I639 Cerebral infarction, unspecified: Secondary | ICD-10-CM

## 2019-07-14 NOTE — Progress Notes (Signed)
[facility]  PATIENT: Kelsey Warner DOB: 1940-11-08  REFERRING CLINICIAN: Harrison Mons, PA HISTORY FROM: patient REASON FOR VISIT: new consult    HISTORICAL  CHIEF COMPLAINT:  Chief Complaint  Patient presents with  . Headache    rm 7, est patient with new problem, "hit self in head by crowbar Oct 2020, no LOC, MRI was ok,  since then I've had pains in my head, memory issues"  MMSE 28    HISTORY OF PRESENT ILLNESS:   UPDATE (07/14/19, VRP): 79 year old female here for evaluation of headaches and memory loss.  Since last visit, was doing well until October 2020 when she was using a crowbar, slipped and crowbar hit her head on the forehead.  She had immediate pain and headache.  She developed right eye periorbital ecchymosis.  Since then she has had intermittent sharp shooting headaches on the right side.  Is also had some intermittent memory loss.  Following this injury she was diagnosed with lichen sclerosis, resulting in severe pain, insomnia and depression.  PRIOR HPI 04/26/16: 79 year old right-handed female here for evaluation of concussion.   04/07/16, in the evening, patient had vertigo lasting for one hour.   04/08/16 , patient was not feeling well. She was having body aches, headache, feeling bad all over. At 8 PM patient went to sleep, which is early for her. At 11:15 PM, patient woke up to go to the bathroom. She stood up, which is her habit before walking so that she doesn't fall down, but then remembers waking up on the floor. Apparently she had collapsed for approximately 20-25 minutes. She apparently fell face first into a glass door. She did not have any tongue biting. Patient had bruising in her arms, legs, shoulders, right eye. She was incontinent of urine. She was able to call her son who advised her to call 911. Patient arrived to the emergency room just after midnight. Patient had a glass shard in her right maxillary region which was removed by  ENT. Patient was discharged by 1 PM the next day.  Patient has been having blurred vision, foggy sensation, cloudy thinking, balance and walking difficulty. Symptoms are slightly improving but persistent.  Patient had follow-up CT scan of the head on 04/17/16 due to persistent symptoms, and was found to have a left basal ganglia hypodensity, suspicious for subacute ischemic infarction, which was not apparent on initial CT from 04/09/16.  Patient has no prior history of seizure. Patient has no prior history of stroke. Patient has had at least 5 fainting spells as a teenager. No family history of seizure. No other prior head traumas, meningitis, CNS problems. Patient lives alone. She has hypercholesterolemia but is intolerant of statins. 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   REVIEW OF SYSTEMS: Full 14 system review of systems performed and negative with exception of: As per HPI.  ALLERGIES: Allergies  Allergen Reactions  . Adhesive [Tape] Other (See Comments)    blisters  . Other     Rolilox, no longer on market   . Penicillins     REACTION: hives, rash  . Statins Other (See Comments)    Tried multiple statins, all caused muscle aches. 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    HOME MEDICATIONS: Outpatient Medications Prior to Visit  Medication Sig Dispense Refill  . aspirin 81 MG tablet Take 81 mg by mouth daily.      . carisoprodol (SOMA) 350 MG tablet Take 1 tablet (  350 mg total) by mouth at bedtime. 30 tablet 0  . Cholecalciferol (VITAMIN D-3 PO) Take 1 tablet by mouth daily. TAKE 1 TAB DAILY     . ezetimibe (ZETIA) 10 MG tablet Take 10 mg by mouth daily.    Marland Kitchen KRILL OIL PO Take 1 tablet by mouth.    . levothyroxine (SYNTHROID, LEVOTHROID) 100 MCG tablet Take 1 tablet (100 mcg total) by mouth daily before breakfast. 90 tablet 3  . metFORMIN (GLUCOPHAGE-XR) 500 MG 24 hr tablet Take 500 mg by mouth daily.    . Naproxen Sodium 220  MG CAPS Take by mouth daily.    . Omega-3 Fatty Acids (FISH OIL) 1000 MG CAPS Take by mouth.    . pregabalin (LYRICA) 100 MG capsule Take 1 capsule (100 mg total) by mouth 3 (three) times daily. 60 capsule 5  . traMADol (ULTRAM) 50 MG tablet Take 1-2 tablets (50-100 mg total) by mouth every 8 (eight) hours as needed. 30 tablet 0  . valACYclovir (VALTREX) 1000 MG tablet TAKE 2 TABLETS BY MOUTH TWICE DAILY FOR HERPES SIMPLEX ON LIP AS DIRECTED 30 tablet prn  . pyridOXINE (VITAMIN B-6) 100 MG tablet Take 100 mg by mouth daily.    . vitamin B-12 (CYANOCOBALAMIN) 1000 MCG tablet Take 1,000 mcg by mouth daily.      Marland Kitchen MAGNESIUM PO Take 1 capsule by mouth.    Marland Kitchen omeprazole (PRILOSEC) 40 MG capsule Take 1 capsule (40 mg total) by mouth daily. 30 capsule 3  . rizatriptan (MAXALT) 5 MG tablet Take 1 tablet (5 mg total) by mouth as needed for migraine. May repeat in 2 hours if needed (Patient not taking: Reported on 09/24/2016) 5 tablet 2   No facility-administered medications prior to visit.    PAST MEDICAL HISTORY: Past Medical History:  Diagnosis Date  . Concussion with loss of consciousness 04/15/2016  . Elevated LFTs   . Fatty liver disease, nonalcoholic   . Fibromyalgia   . Hyperlipidemia   . Migraine   . Neuromuscular disorder ([place])   . Osteopenia   . 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 Thyroid disease    hypothyroid  . Varicose veins     PAST SURGICAL HISTORY: Past Surgical History:  Procedure Laterality Date  . APPENDECTOMY    . BREAST SURGERY     biopsy  . FOREIGN BODY REMOVAL Right 04/09/2016   Procedure: REMOVAL FOREIGN BODY RIGHT FACE;  Surgeon: Leta Baptist, MD;  Location: [facility];  Service: ENT;  Laterality: Right;   removal foreign body from face   . ganglian cyst Right    right hand  . ROTATOR CUFF REPAIR Left   . TONSILLECTOMY    . TUBAL LIGATION      FAMILY HISTORY: Family History  Problem Relation Age of Onset  . Pulmonary fibrosis Father   . Heart  disease Father   . Hyperlipidemia Father   . Heart disease Maternal Grandmother   . Diabetes Maternal Grandmother   . Hypertension Maternal Grandmother   . Stroke Maternal Grandmother   . Mental illness Mother   . Alzheimer's disease Mother   . Mental illness Sister   . Cancer Sister   . COPD Sister   . Cancer Son 61       liver cancer  . Pulmonary fibrosis Paternal Uncle     SOCIAL HISTORY:  Social History   Socioeconomic History  . Marital status: Widowed    Spouse name: n/a  . Number of  children: 3  . Years of education: 18  . Highest education level: Not on file  Occupational History  . 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 Smoking status: Former Smoker    Packs/day: 0.80    Years: 20.00    Pack years: 16.00    Types: Cigarettes    Quit date: 03/18/1977    Years since quitting: 42.3  . Smokeless tobacco: Never Used  Substance and Sexual Activity  . Alcohol use: No    Alcohol/week: 0.0 standard drinks  . Drug use: No  . Sexual activity: Yes    Partners: Male    Birth control/protection: Post-menopausal  Other Topics Concern  . Not on file  Social History Narrative   Pt lives alone.    Husband died of pancreatic cancer at age 63.   Dated for a while, a man in [place], broke up 02/2016.   Her adult children live in [place]) and [place].   Caffeine- coffee, 2 cups daily   Social Determinants of Health   Financial Resource Strain:   . Difficulty of Paying Living Expenses:   Food Insecurity:   . Worried About Charity fundraiser in the Last Year:   . Arboriculturist in the Last Year:   Transportation Needs:   . Film/video editor (Medical):   Marland Kitchen Lack of Transportation (Non-Medical):   Physical Activity:   . Days of Exercise per Week:   . Minutes of Exercise per Session:   Stress:   . Feeling of Stress :   Social Connections:   . Frequency of Communication with Friends  and Family:   . Frequency of Social Gatherings with Friends and Family:   . Attends Religious Services:   . Active Member of Clubs or Organizations:   . Attends Archivist Meetings:   Marland Kitchen Marital Status:   Intimate Partner Violence:   . Fear of Current or Ex-Partner:   . Emotionally Abused:   Marland Kitchen Physically Abused:   . Sexually Abused:      PHYSICAL EXAM  GENERAL EXAM/CONSTITUTIONAL: Vitals:  Vitals:   07/14/19 0802  BP: 133/77  Pulse: 65  Temp: (!) 97.5 F (36.4 C)  Weight: 189 lb 12.8 oz (86.1 kg)  Height: 5\' 7"  (1.702 m)     Body mass index is 29.73 kg/m. Wt Readings from Last 3 Encounters:  07/14/19 189 lb 12.8 oz (86.1 kg)  08/12/17 175 lb (79.4 kg)  04/11/17 177 lb 3.2 oz (80.4 kg)     Patient is in no distress; well developed, nourished and groomed; neck is supple  CARDIOVASCULAR:  Examination of carotid arteries is normal; no carotid bruits  Regular rate and rhythm, no murmurs  Examination of peripheral vascular system by observation and palpation is normal  EYES:  Ophthalmoscopic exam of optic discs and posterior segments is normal; no papilledema or hemorrhages  No exam data present  MUSCULOSKELETAL:  Gait, strength, tone, movements noted in Neurologic exam below  NEUROLOGIC: MENTAL STATUS:  MMSE - [place] Exam 07/14/2019  Orientation to time 5  Orientation to Place 5  Registration 3  Attention/ Calculation 3  Recall 3  Language- name 2 objects 2  Language- repeat 1  Language- follow 3 step command 3  Language- read & follow direction 1  Write a sentence 1  Copy design 1  Total score 28    awake, alert, oriented  to person, place and time  recent and remote memory intact  normal attention and concentration  language fluent, comprehension intact, naming intact  fund of knowledge appropriate  CRANIAL NERVE:   2nd - no papilledema on fundoscopic exam  2nd, 3rd, 4th, 6th - pupils equal and reactive to light,  visual fields full to confrontation, extraocular muscles intact, no nystagmus  5th - facial sensation symmetric  7th - facial strength symmetric  8th - hearing intact  9th - palate elevates symmetrically, uvula midline  11th - shoulder shrug symmetric  12th - tongue protrusion midline  MOTOR:   normal bulk and tone, full strength in the BUE, BLE  SENSORY:   normal and symmetric to light touch  COORDINATION:   finger-nose-finger, fine finger movements normal  REFLEXES:   deep tendon reflexes present and symmetric  GAIT/STATION:   narrow based gait       DIAGNOSTIC DATA (LABS, IMAGING, TESTING) - I reviewed patient records, labs, notes, testing and imaging myself where available.  Lab Results  Component Value Date   WBC 5.8 04/11/2017   HGB 12.3 04/11/2017   HCT 36.0 04/11/2017   MCV 89 04/11/2017   PLT 278 04/11/2017      Component Value Date/Time   NA 141 08/12/2017 1507   K 4.1 08/12/2017 1507   CL 99 08/12/2017 1507   CO2 25 08/12/2017 1507   GLUCOSE 113 (H) 08/12/2017 1507   GLUCOSE 139 (H) 04/09/2016 0117   BUN 14 08/12/2017 1507   CREATININE 0.98 08/12/2017 1507   CREATININE 0.69 09/12/2015 0958   CALCIUM 9.9 08/12/2017 1507   PROT 7.5 08/12/2017 1507   ALBUMIN 4.5 08/12/2017 1507   AST 27 08/12/2017 1507   ALT 28 08/12/2017 1507   ALKPHOS 73 08/12/2017 1507   BILITOT 0.2 08/12/2017 1507   GFRNONAA 57 (L) 08/12/2017 1507   GFRNONAA >89 04/29/2014 1023   GFRAA 66 08/12/2017 1507   GFRAA >89 04/29/2014 1023   Lab Results  Component Value Date   CHOL 300 (H) 08/12/2017   HDL 49 08/12/2017   LDLCALC 207 (H) 08/12/2017   TRIG 218 (H) 08/12/2017   CHOLHDL 6.1 (H) 08/12/2017   Lab Results  Component Value Date   HGBA1C 6.2 (H) 08/12/2017   Lab Results  Component Value Date   VITAMINB12 1,007 (H) 12/19/2011   Lab Results  Component Value Date   TSH 1.060 08/12/2017    04/17/16 CT head [I reviewed images myself and agree with  interpretation. -VRP]  - Poorly defined low-attenuation area in the left gait basal ganglia consistent with subacute or acute lacunar infarct. No hemorrhage.  04/09/16 CT maxillofacial [I reviewed images myself and agree with interpretation. -VRP]  - Penetrating injury through the right anterior face with a linear and blade like structure extending through the fractures of the anterior wall of the right maxillary sinus and right orbital floor. The tip of the penetrating object is located in the inferior aspect of the right orbit of inferior to the inferior rectus muscle. Correlation with clinical exam recommended to exclude ocular entrapment. No orbital hematoma or traumatic ocular injury.  - Right maxillary hemosinus.  04/09/16 CT head [I reviewed images myself and agree with interpretation. -VRP]  1. No acute intracranial process identified. 2. Acute right orbital floor fracture with suspected superimposed radiopaque foreign body, incompletely evaluated on this exam. Further evaluation with dedicated maxillofacial CT recommended. 3. Acute scalp contusion/laceration at the left vertex.  04/09/16 CT CERVICAL SPINE -  No acute traumatic injury within the cervical spine.  05/04/16 MRI brain MRI scan of the brain showing a subacute enhancing  left lentiform nucleus infarct and mild changes of chronic microvascular ischemia. Compared with CT head dated 04/17/16  expected evolutionary changes are noted.  05/04/16 MRA head  - unremarkable  05/04/16 MRA neck - mild plaques at origin of both internal carotids with < 50% diamater stenosis. Both vertebral arteries show antegrade flow.  05/10/16 TTE - Normal LV systolic function; grade 1 diastolic dysfunction; mild LVH.   05/02/16 EEG - normal  05/10/19 CT head - Mild cerebral atrophy. - Mild periventricular and subcortical hypoattenuation. 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    ASSESSMENT AND PLAN  79 y.o. year old  female here with new onset episode of loss of consciousness, followed by significant face, head trauma, postconcussion syndrome, also found to have hypodensity in the left basal ganglia. Unclear etiology of initial cause of syncope. This could have been due to orthostatic hypotension, stroke, seizure or some other cause. Although left basal ganglia infarct should not cause syncope, patient may have had vertebrobasilar insufficiency, cardioembolic source of stroke and therefore MRI of the brain will help further evaluate these possibilities.  Now with new concussion in Oct 2020.  Dx:   1. Post concussion syndrome      PLAN:  POST CONCUSSION SYNDROME (Oct 2020) - gradually increase activities as tolerated - reviewed nutrition, exercise, rest strategies  STROKE PREVENTION - continue aspirin, zetia  GAIT DIFFICULTY (lumbar spinal stenosis) - fall precautions; follow up with spine surgery clinic  Return for pending if symptoms worsen or fail to improve.    Penni Bombard, MD [id number], [id number] AM Certified in Neurology, Neurophysiology and Neuroimaging  [facility] [address] [place], [place] [address] [phone]

## 2020-04-24 ENCOUNTER — Other Ambulatory Visit: Payer: Self-pay | Admitting: Physician Assistant

## 2020-04-24 DIAGNOSIS — Z1231 Encounter for screening mammogram for malignant neoplasm of breast: Secondary | ICD-10-CM

## 2020-06-09 ENCOUNTER — Ambulatory Visit
Admission: RE | Admit: 2020-06-09 | Discharge: 2020-06-09 | Disposition: A | Discharge status: Home / Self Care | Payer: Medicare Other | Source: Ambulatory Visit | Attending: Physician Assistant | Admitting: Physician Assistant

## 2020-06-09 ENCOUNTER — Other Ambulatory Visit: Payer: Self-pay

## 2020-06-09 DIAGNOSIS — Z1231 Encounter for screening mammogram for malignant neoplasm of breast: Secondary | ICD-10-CM

## 2021-01-16 DEATH — deceased

## 2021-07-28 IMAGING — MG MM DIGITAL SCREENING BILAT W/ TOMO AND CAD
6 of 10 series · 6 of 30 positions shown · non-contrast
Comparison: Previous exam(s).

CLINICAL DATA: Screening.

EXAM:
DIGITAL SCREENING BILATERAL MAMMOGRAM WITH TOMOSYNTHESIS AND CAD
TECHNIQUE: Bilateral screening digital craniocaudal and mediolateral oblique
mammograms were obtained. Bilateral screening digital breast
tomosynthesis was performed. The images were evaluated with
computer-aided detection.

[L MLO synth-2D (1 of 2)]
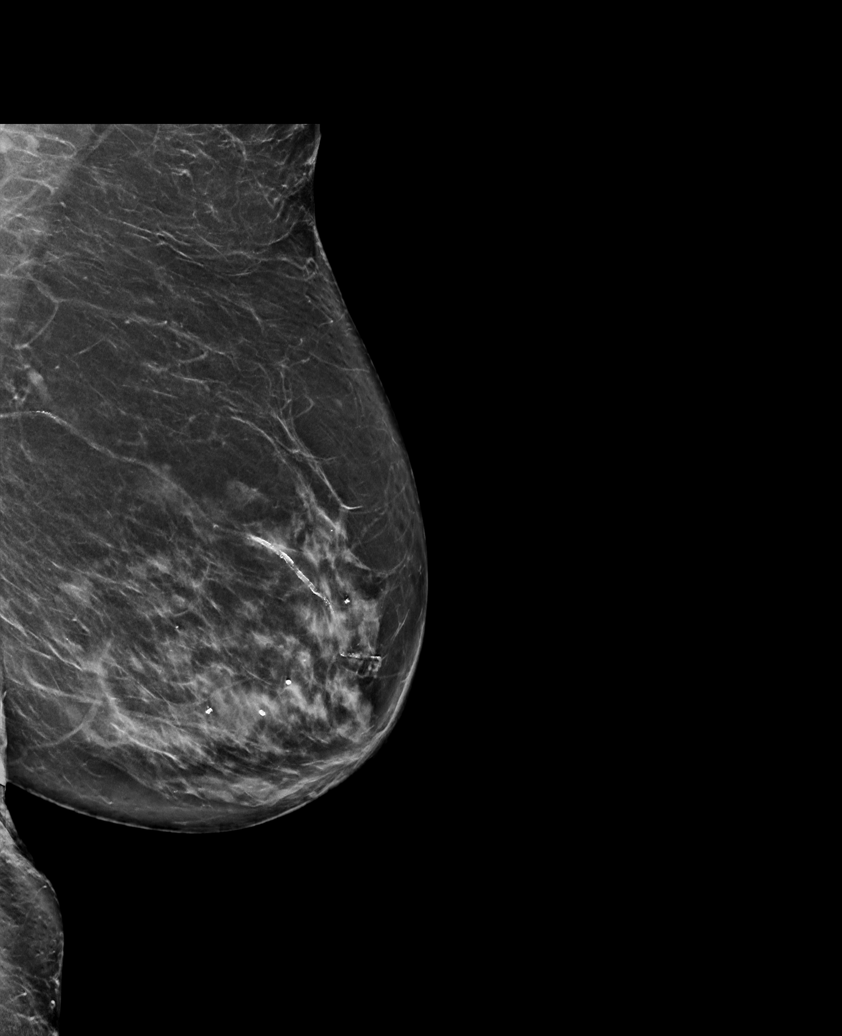

[R CC synth-2D]
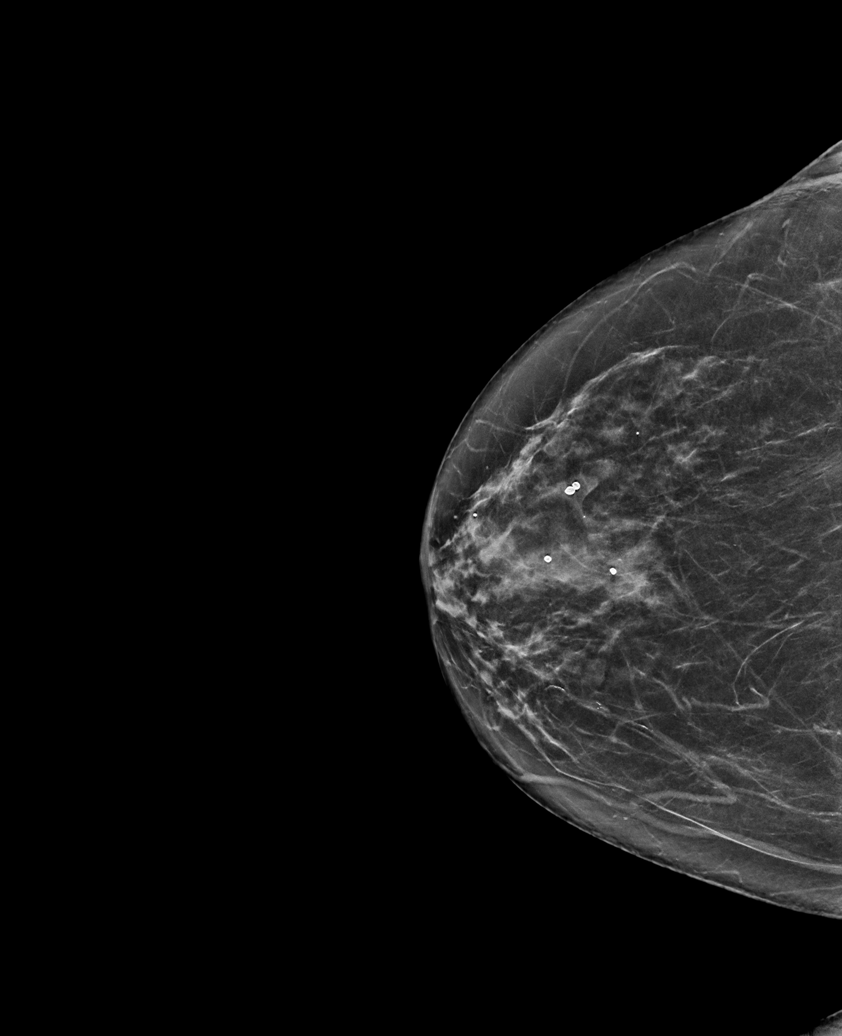

[R MLO synth-2D]
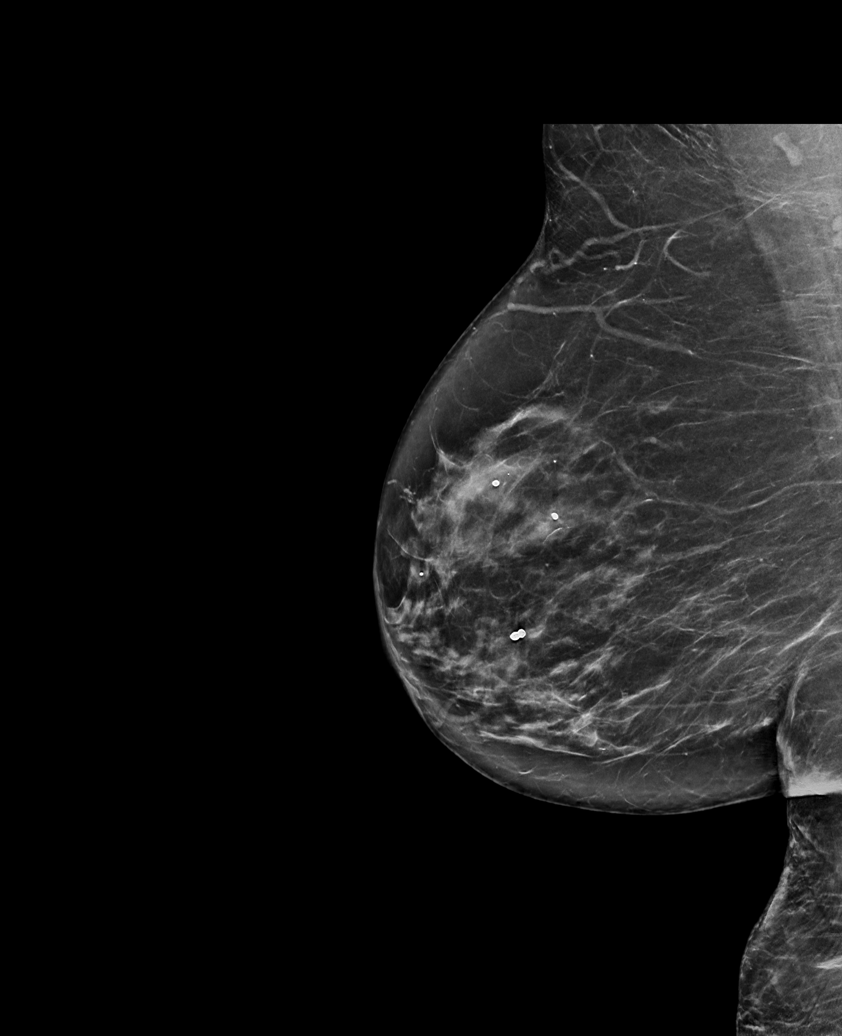

[L MLO synth-2D (2 of 2)]
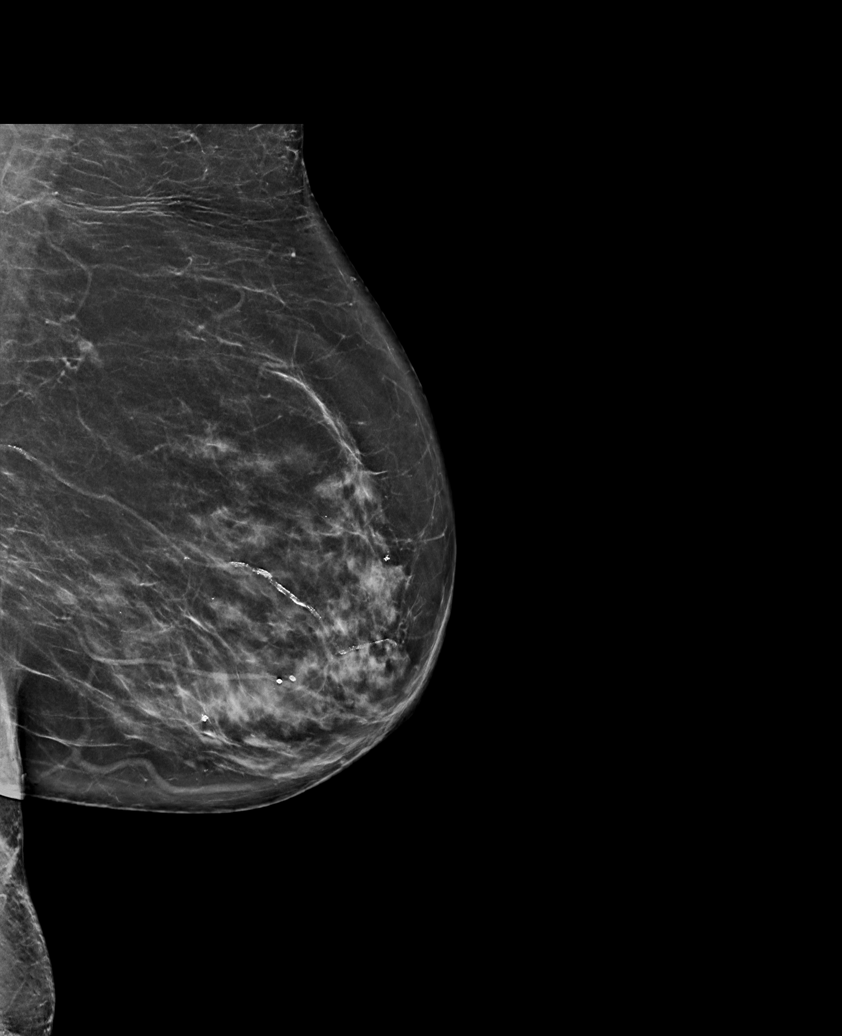

[L CC synth-2D]
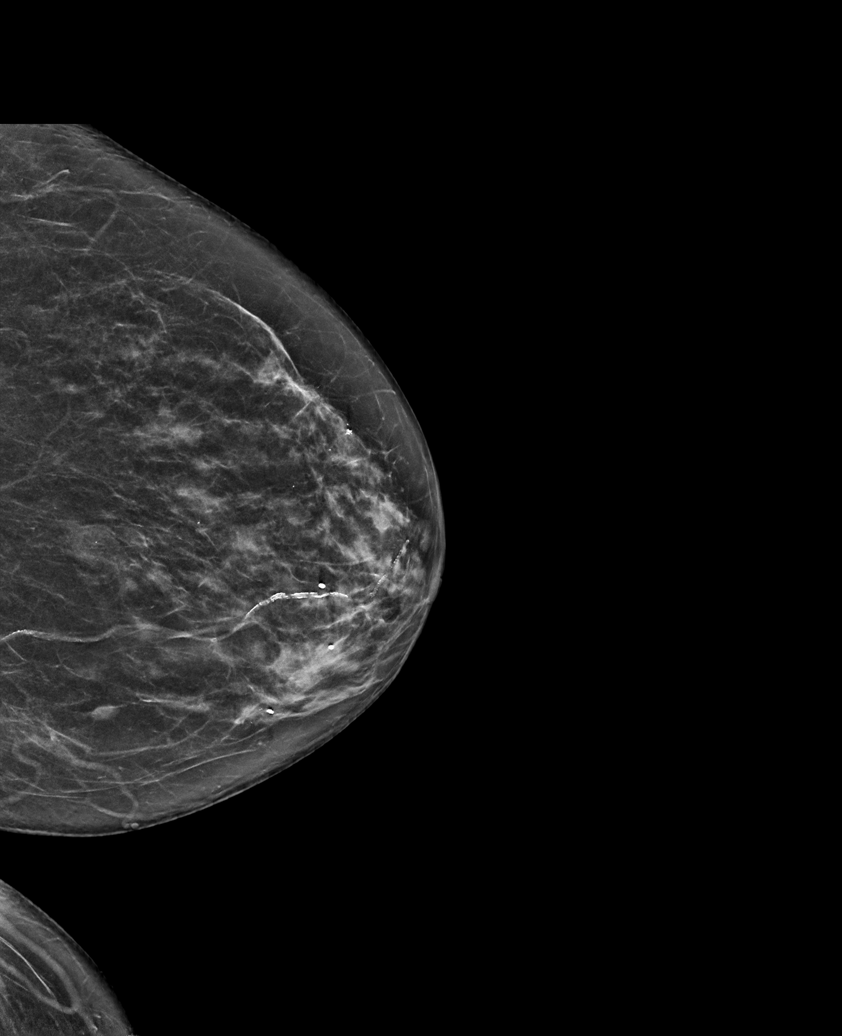

[R CC tomo · tomo slice 33/66.0]
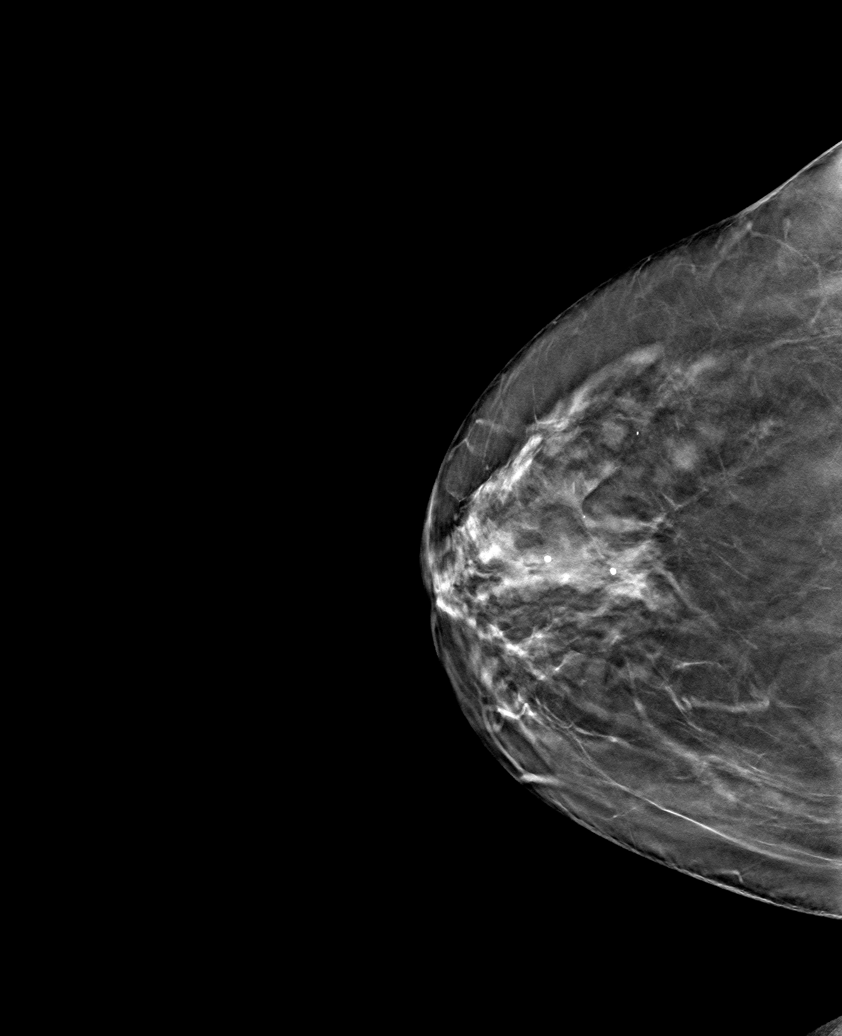

[6 of 30 positions shown; findings below may reference images not displayed]

ACR Breast Density Category c: The breast tissue is heterogeneously
dense, which may obscure small masses.
FINDINGS: There are no findings suspicious for malignancy. The images were
evaluated with computer-aided detection.
IMPRESSION: No mammographic evidence of malignancy. A result letter of this
screening mammogram will be mailed directly to the patient.

RECOMMENDATION:
Screening mammogram in one year. (Code:T4-5-GWO)

BI-RADS CATEGORY  1: Negative.
